# Patient Record
Sex: Male | Born: 1960 | Race: White | Hispanic: No | Marital: Married | Smoking: Former smoker
Health system: Southern US, Community
[De-identification: ages and names within clinical notes are randomized; demographics above are authoritative.]

## PROBLEM LIST (undated history)

## (undated) DIAGNOSIS — E785 Hyperlipidemia, unspecified: Secondary | ICD-10-CM

## (undated) DIAGNOSIS — N200 Calculus of kidney: Secondary | ICD-10-CM

## (undated) DIAGNOSIS — K219 Gastro-esophageal reflux disease without esophagitis: Secondary | ICD-10-CM

## (undated) DIAGNOSIS — T7840XA Allergy, unspecified, initial encounter: Secondary | ICD-10-CM

## (undated) HISTORY — DX: Gastro-esophageal reflux disease without esophagitis: K21.9

## (undated) HISTORY — DX: Allergy, unspecified, initial encounter: T78.40XA

## (undated) HISTORY — DX: Hyperlipidemia, unspecified: E78.5

## (undated) HISTORY — PX: LITHOTRIPSY: SUR834

## (undated) HISTORY — DX: Calculus of kidney: N20.0

---

## 2002-02-27 ENCOUNTER — Ambulatory Visit (HOSPITAL_COMMUNITY): Admission: RE | Admit: 2002-02-27 | Discharge: 2002-02-27 | Payer: Self-pay | Admitting: Gastroenterology

## 2002-09-17 ENCOUNTER — Encounter: Admission: RE | Admit: 2002-09-17 | Discharge: 2002-09-17 | Payer: Self-pay | Admitting: Urology

## 2002-09-17 ENCOUNTER — Encounter: Payer: Self-pay | Admitting: Urology

## 2002-10-30 ENCOUNTER — Encounter: Payer: Self-pay | Admitting: Urology

## 2002-10-30 ENCOUNTER — Encounter: Admission: RE | Admit: 2002-10-30 | Discharge: 2002-10-30 | Payer: Self-pay | Admitting: Urology

## 2002-11-04 ENCOUNTER — Encounter: Payer: Self-pay | Admitting: Urology

## 2002-11-04 ENCOUNTER — Ambulatory Visit (HOSPITAL_BASED_OUTPATIENT_CLINIC_OR_DEPARTMENT_OTHER): Admission: RE | Admit: 2002-11-04 | Discharge: 2002-11-04 | Payer: Self-pay | Admitting: Urology

## 2002-11-19 ENCOUNTER — Encounter: Payer: Self-pay | Admitting: Urology

## 2002-11-19 ENCOUNTER — Encounter: Admission: RE | Admit: 2002-11-19 | Discharge: 2002-11-19 | Payer: Self-pay | Admitting: Urology

## 2013-02-01 ENCOUNTER — Ambulatory Visit (INDEPENDENT_AMBULATORY_CARE_PROVIDER_SITE_OTHER): Payer: BC Managed Care – PPO | Admitting: Nurse Practitioner

## 2013-02-01 DIAGNOSIS — S91009A Unspecified open wound, unspecified ankle, initial encounter: Secondary | ICD-10-CM

## 2013-02-01 DIAGNOSIS — S81011A Laceration without foreign body, right knee, initial encounter: Secondary | ICD-10-CM

## 2013-02-01 DIAGNOSIS — Z23 Encounter for immunization: Secondary | ICD-10-CM

## 2013-02-01 MED ORDER — CEPHALEXIN 500 MG PO CAPS: 500.0000 mg | ORAL_CAPSULE | Freq: Three times a day (TID) | ORAL | Status: DC

## 2013-02-01 NOTE — Patient Instructions (Signed)
Wound Care  Wound care helps prevent pain and infection.    You may need a tetanus shot if:   You cannot remember when you had your last tetanus shot.   You have never had a tetanus shot.   The injury broke your skin.  If you need a tetanus shot and you choose not to have one, you may get tetanus. Sickness from tetanus can be serious.  HOME CARE     Only take medicine as told by your doctor.   Clean the wound daily with mild soap and water.   Change any bandages (dressings) as told by your doctor.   Put medicated cream and a bandage on the wound as told by your doctor.   Change the bandage if it gets wet, dirty, or starts to smell.   Take showers. Do not take baths, swim, or do anything that puts your wound under water.   Rest and raise (elevate) the wound until the pain and puffiness (swelling) are better.   Keep all doctor visits as told.  GET HELP RIGHT AWAY IF:     Yellowish-white fluid (pus) comes from the wound.   Medicine does not lessen your pain.   There is a red streak going away from the wound.   You have a fever.  MAKE SURE YOU:     Understand these instructions.   Will watch your condition.   Will get help right away if you are not doing well or get worse.  Document Released: 07/26/2008 Document Revised: 01/09/2012 Document Reviewed: 02/20/2011  Exit[employer] Patient Information 2013 [employer].

## 2013-02-01 NOTE — Progress Notes (Signed)
  Subjective:    Patient ID: Nicolas Ho, male    DOB: 1961-08-05, 52 y.o.   MRN: [id number]  Laceration  The incident occurred less than 1 hour ago. Pain location: R. Knee. The laceration is 6 cm in size. Injury mechanism: chainsaw. The patient is experiencing no pain. He reports no foreign bodies present. His tetanus status is out of date.  No numbness or tingling distally. Pt will receive Tetanus shot today.  Review of Systems  Constitutional: Negative.        Objective:   Physical Exam  Musculoskeletal:       Legs: 6 cm laceration to R. Knee cap   Verbal consent given by ptient. Local anesthesia Lidocaine 2% with 9ml Betadine prep 3-0 suture-8 simple interrupted stitches Cleaned with Saline Antibiotic ointment Dressing applied        Assessment & Plan:  Laceration  Keep clean and dry  Watch for signs of infection  retrun to office in 10 days stitch removal  TD shot today  Keflex as Rx  Nicolas Daphine Deutscher, FNP

## 2013-02-11 ENCOUNTER — Encounter: Payer: Self-pay | Admitting: Nurse Practitioner

## 2013-02-11 ENCOUNTER — Ambulatory Visit (INDEPENDENT_AMBULATORY_CARE_PROVIDER_SITE_OTHER): Payer: BC Managed Care – PPO | Admitting: Nurse Practitioner

## 2013-02-11 VITALS — BP 128/82 | HR 79 | Temp 97.6°F | Ht 68.0 in | Wt 168.0 lb

## 2013-02-11 DIAGNOSIS — Z4802 Encounter for removal of sutures: Secondary | ICD-10-CM

## 2013-02-11 NOTE — Progress Notes (Signed)
  Subjective:    Patient ID: Nicolas Ho, male    DOB: [birth year], 52 y.o.   MRN: [id number]  HPI Pt is here today for removal of sutures from knee. Pt has not noticed any signs of infection and denies any fevers.    Review of Systems  All other systems reviewed and are negative.       Objective:   Physical Exam  Wound edges of R. Knee well approximated. No erythema. No edema.       Assessment & Plan:  Sutures Removed  Dermabond applied  Keep clean and dry  Do not pick!  RTO PRN  Mary-Margaret Daphine Deutscher, FNP

## 2013-03-29 ENCOUNTER — Encounter: Payer: Self-pay | Admitting: Physician Assistant

## 2013-03-29 ENCOUNTER — Ambulatory Visit (INDEPENDENT_AMBULATORY_CARE_PROVIDER_SITE_OTHER): Payer: BC Managed Care – PPO | Admitting: Physician Assistant

## 2013-03-29 VITALS — BP 140/85 | HR 74 | Temp 97.2°F | Ht 69.0 in | Wt 159.0 lb

## 2013-03-29 DIAGNOSIS — R42 Dizziness and giddiness: Secondary | ICD-10-CM

## 2013-03-29 DIAGNOSIS — J309 Allergic rhinitis, unspecified: Secondary | ICD-10-CM

## 2013-03-29 LAB — COMPLETE METABOLIC PANEL WITH GFR
ALT: 17 U/L (ref 0–53)
AST: 18 U/L (ref 0–37)
Albumin: 4.5 g/dL (ref 3.5–5.2)
Alkaline Phosphatase: 41 U/L (ref 39–117)
BUN: 17 mg/dL (ref 6–23)
CO2: 28 mEq/L (ref 19–32)
Calcium: 9.9 mg/dL (ref 8.4–10.5)
Chloride: 103 mEq/L (ref 96–112)
Creat: 1.08 mg/dL (ref 0.50–1.35)
GFR, Est African American: >89 mL/min
GFR, Est Non African American: 78 mL/min
Glucose, Bld: 93 mg/dL (ref 70–99)
Potassium: 4.5 mEq/L (ref 3.5–5.3)
Sodium: 140 mEq/L (ref 135–145)
Total Bilirubin: 0.5 mg/dL (ref 0.3–1.2)
Total Protein: 6.7 g/dL (ref 6.0–8.3)

## 2013-03-29 LAB — POCT CBC
Granulocyte percent: 69.1 %G (ref 37–80)
HCT, POC: 39.9 % — AB (ref 43.5–53.7)
Hemoglobin: 13.3 g/dL — AB (ref 14.1–18.1)
Lymph, poc: 2.1 (ref 0.6–3.4)
MCH, POC: 29.6 pg (ref 27–31.2)
MCHC: 33.4 g/dL (ref 31.8–35.4)
MCV: 88.8 fL (ref 80–97)
MPV: 7.8 fL (ref 0–99.8)
POC Granulocyte: 5.7 (ref 2–6.9)
POC LYMPH PERCENT: 25.8 %L (ref 10–50)
Platelet Count, POC: 296 10*3/uL (ref 142–424)
RBC: 4.5 M/uL — AB (ref 4.69–6.13)
RDW, POC: 13.1 %
WBC: 8.2 10*3/uL (ref 4.6–10.2)

## 2013-03-29 MED ORDER — FLUTICASONE PROPIONATE 50 MCG/ACT NA SUSP: 2.0000 | Freq: Every day | NASAL | Status: DC

## 2013-03-29 NOTE — Progress Notes (Signed)
  Subjective:    Patient ID: Nicolas Ho, male    DOB: 29-Nov-1960, 52 y.o.   MRN: [id number]  HPI 52 y/o white male c/o occassional light-headedness and intermittent headache x 3 wks. He states that he has been working outside a lot lately and feels that his symptoms have worsened since being outside. Associated symptoms of increased sneezing and sinus pressure. He found three ticks on him over the past month and wants to make sure that his symptoms are not associated.     Review of Systems  Constitutional: Positive for fatigue. Negative for fever, chills and unexpected weight change.  HENT: Positive for sneezing and sinus pressure. Negative for hearing loss, ear pain, neck stiffness and ear discharge.   Eyes: Negative for itching.  Respiratory: Negative for apnea, cough, chest tightness, shortness of breath and wheezing.   Cardiovascular: Negative for chest pain, palpitations and leg swelling.  Gastrointestinal: Negative for nausea, vomiting, diarrhea and constipation.  Endocrine: Negative for cold intolerance, heat intolerance, polydipsia and polyuria.  Genitourinary: Negative for dysuria, urgency and frequency.  Skin: Negative for color change, pallor, rash and wound.  Neurological: Positive for dizziness (occasional when going from leaning forward to standing), light-headedness and headaches (increased frontal pressure).       Objective:   Physical Exam  Constitutional: He appears well-developed and well-nourished.  HENT:  Head: Normocephalic.  Right Ear: External ear normal.  Left Ear: External ear normal.  Mouth/Throat: Oropharynx is clear and moist.  Eyes: EOM are normal. Pupils are equal, round, and reactive to light.  Neck: No thyromegaly present.  Cardiovascular: Normal rate, regular rhythm and normal heart sounds.  Exam reveals no gallop and no friction rub.   No murmur heard. Pulmonary/Chest: No respiratory distress. He has no wheezes. He has no rales.   Lymphadenopathy:    He has no cervical adenopathy.  Skin: No rash noted. No erythema. No pallor.          Assessment & Plan:  I do not see any indications on PE that would lead me to feel that his cc is due to rickettsial associated infection. I am ordering CBC, CMP today to r/o any infection, anemia, electrolyte abnormalities or hypoglycemia. At this time, I feel like he is suffering from seasonal allergic rhinitis symptoms, which will hopefully be improved with regular use of the Flonase. Patient states that he is going to make an appt for a thorough physical examination in July 2014 and he will follow up sooner if symptoms worsen or persist. His BP today was elevated from previous visit so I advised him to check his BP regular at home so he will have records of it for his physical in July.

## 2013-05-10 ENCOUNTER — Ambulatory Visit (INDEPENDENT_AMBULATORY_CARE_PROVIDER_SITE_OTHER): Payer: BC Managed Care – PPO

## 2013-05-10 ENCOUNTER — Ambulatory Visit (INDEPENDENT_AMBULATORY_CARE_PROVIDER_SITE_OTHER): Payer: BC Managed Care – PPO | Admitting: Family Medicine

## 2013-05-10 ENCOUNTER — Encounter: Payer: Self-pay | Admitting: Family Medicine

## 2013-05-10 ENCOUNTER — Other Ambulatory Visit: Payer: Self-pay | Admitting: Family Medicine

## 2013-05-10 VITALS — BP 119/74 | HR 78 | Temp 97.9°F | Ht 68.0 in | Wt 155.0 lb

## 2013-05-10 DIAGNOSIS — J302 Other seasonal allergic rhinitis: Secondary | ICD-10-CM | POA: Insufficient documentation

## 2013-05-10 DIAGNOSIS — R5381 Other malaise: Secondary | ICD-10-CM

## 2013-05-10 DIAGNOSIS — R0989 Other specified symptoms and signs involving the circulatory and respiratory systems: Secondary | ICD-10-CM

## 2013-05-10 DIAGNOSIS — J3489 Other specified disorders of nose and nasal sinuses: Secondary | ICD-10-CM

## 2013-05-10 DIAGNOSIS — J309 Allergic rhinitis, unspecified: Secondary | ICD-10-CM

## 2013-05-10 DIAGNOSIS — Z0000 Encounter for general adult medical examination without abnormal findings: Secondary | ICD-10-CM | POA: Insufficient documentation

## 2013-05-10 DIAGNOSIS — E785 Hyperlipidemia, unspecified: Secondary | ICD-10-CM

## 2013-05-10 DIAGNOSIS — R5383 Other fatigue: Secondary | ICD-10-CM

## 2013-05-10 DIAGNOSIS — R0981 Nasal congestion: Secondary | ICD-10-CM

## 2013-05-10 DIAGNOSIS — Z119 Encounter for screening for infectious and parasitic diseases, unspecified: Secondary | ICD-10-CM

## 2013-05-10 DIAGNOSIS — E559 Vitamin D deficiency, unspecified: Secondary | ICD-10-CM

## 2013-05-10 LAB — POCT URINALYSIS DIPSTICK
Bilirubin, UA: NEGATIVE
Glucose, UA: NEGATIVE
Ketones, UA: NEGATIVE
Leukocytes, UA: NEGATIVE
Nitrite, UA: NEGATIVE
Protein, UA: NEGATIVE
Spec Grav, UA: 1.02
Urobilinogen, UA: NEGATIVE
pH, UA: 5

## 2013-05-10 LAB — COMPLETE METABOLIC PANEL WITH GFR
ALT: 15 U/L (ref 0–53)
AST: 14 U/L (ref 0–37)
Albumin: 4.7 g/dL (ref 3.5–5.2)
Alkaline Phosphatase: 39 U/L (ref 39–117)
BUN: 11 mg/dL (ref 6–23)
CO2: 28 mEq/L (ref 19–32)
Calcium: 9.6 mg/dL (ref 8.4–10.5)
Chloride: 105 mEq/L (ref 96–112)
Creat: 0.99 mg/dL (ref 0.50–1.35)
GFR, Est African American: >89 mL/min
GFR, Est Non African American: 87 mL/min
Glucose, Bld: 89 mg/dL (ref 70–99)
Potassium: 4.4 mEq/L (ref 3.5–5.3)
Sodium: 140 mEq/L (ref 135–145)
Total Bilirubin: 0.4 mg/dL (ref 0.3–1.2)
Total Protein: 6.8 g/dL (ref 6.0–8.3)

## 2013-05-10 LAB — POCT CBC
Granulocyte percent: 70.9 %G (ref 37–80)
HCT, POC: 37.4 % — AB (ref 43.5–53.7)
Hemoglobin: 13 g/dL — AB (ref 14.1–18.1)
Lymph, poc: 1.5 (ref 0.6–3.4)
MCH, POC: 30.5 pg (ref 27–31.2)
MCHC: 34.7 g/dL (ref 31.8–35.4)
MCV: 87.9 fL (ref 80–97)
MPV: 7.5 fL (ref 0–99.8)
POC Granulocyte: 4.8 (ref 2–6.9)
POC LYMPH PERCENT: 22.2 %L (ref 10–50)
Platelet Count, POC: 303 10*3/uL (ref 142–424)
RBC: 4.3 M/uL — AB (ref 4.69–6.13)
RDW, POC: 12.2 %
WBC: 6.8 10*3/uL (ref 4.6–10.2)

## 2013-05-10 LAB — HEPATITIS C ANTIBODY: HCV Ab: NEGATIVE

## 2013-05-10 LAB — VITAMIN B12: Vitamin B-12: 434 pg/mL (ref 211–911)

## 2013-05-10 LAB — TSH: TSH: 1.336 u[IU]/mL (ref 0.350–4.500)

## 2013-05-10 NOTE — Progress Notes (Signed)
Patient ID: Nicolas Ho, male   DOB: 1961/07/09, 52 y.o.   MRN: [id number] SUBJECTIVE: CC: Chief Complaint  Patient presents with  . Annual Exam    cpx  needs refills last EKG-2013  fasting     HPI: Here for annual Physical. However he was seen here couple months ago and has persistent pressure in his  Sinuses and a chest congestion feeling left upper chest with goughing up phlegm. May all be allergies but in his teens early 98s he smoked a little. Rest of his health problems have  Been fine.  Past Medical History  Diagnosis Date  . GERD (gastroesophageal reflux disease)   . Hyperlipidemia    Past Surgical History  Procedure Laterality Date  . Lithotripsy     History   Social History  . Marital Status: Married    Spouse Name: N/A    Number of Children: N/A  . Years of Education: N/A   Occupational History  . custodian     [employer]   Social History Main Topics  . Smoking status: Former Smoker    Types: Cigarettes    Quit date: 05/11/1983  . Smokeless tobacco: Not on file  . Alcohol Use: Yes  . Drug Use: No  . Sexually Active: Not on file   Other Topics Concern  . Not on file   Social History Narrative   Married   2 children   1 dog         Family History  Problem Relation Age of Onset  . Diabetes Mother   . Cancer Father   . Hyperlipidemia Sister   . Heart disease Brother   . Cancer Brother    Current Outpatient Prescriptions on File Prior to Visit  Medication Sig Dispense Refill  . cholecalciferol (VITAMIN D) 1000 UNITS tablet Take 1,000 Units by mouth daily.      . fluticasone (FLONASE) 50 MCG/ACT nasal spray Place 2 sprays into the nose daily.  16 g  6   No current facility-administered medications on file prior to visit.   No Active Allergies Immunization History  Administered Date(s) Administered  . Tdap 02/01/2013   Prior to Admission medications   Medication Sig Start Date End Date Taking? Authorizing Provider  cholecalciferol  (VITAMIN D) 1000 UNITS tablet Take 1,000 Units by mouth daily.   Yes Historical Provider, MD  simvastatin (ZOCOR) 10 MG tablet Take 5 mg by mouth at bedtime.   Yes Historical Provider, MD  fluticasone (FLONASE) 50 MCG/ACT nasal spray Place 2 sprays into the nose daily. 03/29/13   Tiffany Gann, PA-C     ROS: As above in the HPI. All other systems are stable or negative.  OBJECTIVE: APPEARANCE:  Patient in no acute distress.The patient appeared well nourished and normally developed. Acyanotic. Waist: VITAL SIGNS:BP 119/74  Pulse 78  Temp(Src) 97.9 F (36.6 C) (Oral)  Ht 5\' 8"  (1.727 m)  Wt 155 lb (70.308 kg)  BMI 23.57 kg/m2 WM looks well. congested  SKIN: warm and  Dry without overt rashes, tattoos and scars  HEAD and Neck: without JVD, Head and scalp: normal Eyes:No scleral icterus. Fundi normal, eye movements normal. Ears: Auricle normal, canal normal, Tympanic membranes normal, insufflation normal. Nose: boggy nasal mucosa Throat: normal Neck & thyroid: normal  CHEST & LUNGS: Chest wall: normal Lungs: Clear, with an occasional congested cough  CVS: Reveals the PMI to be normally located. Regular rhythm, First and Second Heart sounds are normal,  absence of murmurs,  rubs or gallops. Peripheral vasculature: Radial pulses: normal Dorsal pedis pulses: normal Posterior pulses: normal  ABDOMEN:  Appearance: normal Benign, no organomegaly, no masses, no Abdominal Aortic enlargement. No Guarding , no rebound. No Bruits. Bowel sounds: normal  RECTAL: Normal, prostate normal and his stools were heme negative and scant brown GU: Normal testes , penis and no hernias detected.  EXTREMETIES: nonedematous. Both Femoral and Pedal pulses are normal.  MUSCULOSKELETAL:  Spine: normal Joints: intact  NEUROLOGIC: oriented to time,place and person; nonfocal. Strength is normal Sensory is normal Reflexes are normal Cranial Nerves are normal.  ASSESSMENT: Annual  physical exam  HLD (hyperlipidemia) - Plan: NMR Lipoprofile with Lipids, COMPLETE METABOLIC PANEL WITH GFR  Seasonal allergic rhinitis  Unspecified vitamin D deficiency  Chest congestion - Plan: DG Chest 2 View  Head congestion - Plan: DG Sinuses Complete  Other malaise and fatigue - Plan: TSH, POCT CBC, Vitamin B12, Vitamin D 25 hydroxy, POCT urinalysis dipstick  Screening examination for infectious disease - Plan: Hepatitis C antibody   PLAN:  [facility] reading (PRIMARY) by  Dr. Modesto Charon Sinus films and CXray were no acute findings.  Orders Placed This Encounter  Procedures  . DG Sinuses Complete    Standing Status: Future     Number of Occurrences: 1     Standing Expiration Date: 07/10/2014    Order Specific Question:  Reason for Exam (SYMPTOM  OR DIAGNOSIS REQUIRED)    Answer:  head congestion    Order Specific Question:  Preferred imaging location?    Answer:  Internal  . DG Chest 2 View    Standing Status: Future     Number of Occurrences: 1     Standing Expiration Date: 07/10/2014    Order Specific Question:  Reason for Exam (SYMPTOM  OR DIAGNOSIS REQUIRED)    Answer:  chest congestion  ex-smoker    Order Specific Question:  Preferred imaging location?    Answer:  Internal  . NMR Lipoprofile with Lipids  . COMPLETE METABOLIC PANEL WITH GFR  . TSH  . Vitamin B12  . Vitamin D 25 hydroxy  . Hepatitis C antibody  . POCT CBC  . POCT urinalysis dipstick   Meds ordered this encounter  Medications  . simvastatin (ZOCOR) 10 MG tablet    Sig: Take 5 mg by mouth at bedtime.                                 HEALTH MAINTENANCE Immunizations: Tetanus-Diphtheria Booster due:02/02/2023 Pertusis Booster due:02/02/2023 Flu Shot ZOX:[id number] Herpes Zoster/Shingles Vaccine due:at 59 years HPV due:n/a  Healthy Life Habits:  Exercise Goal: 5-6 days/week; start gradually(ie 30 minutes/3days per week)  Nutrition: Balanced healthy meals including Vegetables and Fruits. Consider   Reading the following books: 1) Eat to Live by Dr Ottis Stain; 2) Prevent and Reverse Heart Disease by Pneumonia Vaccine:at 64 years Dr Suzzette Righter.  Vitamins:regular Multivitamin Aspirin:81 mg Stop Tobacco JWJ:XBJYN  tobacco Seat Belt Use:+++ Sunscreen Use:+++  Recommended Screening Tests: Colon Cancer Screening: 2022 Blood work: today Cholesterol Screening:       today HIV:    ?   Hepatitis C(people born 26-1966): to be done  Monthly Self Testicular Exam:+++  Eye Exam:every 1 to 2 years Dental Health:every 6 months  Others:    Living Will/Healthcare Power of Attorney:  Use the flonase.  Observe for now  Await labs.  Return in about 6 months (around 11/10/2013)  for Recheck medical problems.  Mike Berntsen P. Modesto Charon, M.D.

## 2013-05-10 NOTE — Patient Instructions (Addendum)
HEALTH MAINTENANCE Immunizations: Tetanus-Diphtheria Booster due:02/02/2023 Pertusis Booster due:02/02/2023 Flu Shot ZOX:WRUEAVWU Herpes Zoster/Shingles Vaccine due:at 59 years HPV due:n/a  Healthy Life Habits:  Exercise Goal: 5-6 days/week; start gradually(ie 30 minutes/3days per week)  Nutrition: Balanced healthy meals including Vegetables and Fruits. Consider  Reading the following books: 1) Eat to Live by Dr Ottis Stain; 2) Prevent and Reverse Heart Disease by Pneumonia Vaccine:at 64 years Dr Suzzette Righter.  Vitamins:regular Multivitamin Aspirin:81 mg Stop Tobacco JWJ:XBJYN  tobacco Seat Belt Use:+++ Sunscreen Use:+++  Recommended Screening Tests: Colon Cancer Screening: 2022 Blood work: today Cholesterol Screening:       today HIV:    ?   Hepatitis C(people born 47-1966): to be done  Monthly Self Testicular Exam:+++  Eye Exam:every 1 to 2 years Dental Health:every 6 months  Others:    Living Will/Healthcare Power of Attorney:

## 2013-05-11 LAB — VITAMIN D 25 HYDROXY (VIT D DEFICIENCY, FRACTURES): Vit D, 25-Hydroxy: 57 ng/mL (ref 30–89)

## 2013-05-13 ENCOUNTER — Other Ambulatory Visit: Payer: Self-pay | Admitting: *Deleted

## 2013-05-13 DIAGNOSIS — Z0000 Encounter for general adult medical examination without abnormal findings: Secondary | ICD-10-CM

## 2013-05-13 LAB — PSA: PSA: 0.93 ng/mL (ref <=4.00)

## 2013-05-13 NOTE — Addendum Note (Signed)
Addended by: Orma Render F on: 05/13/2013 08:30 AM   Modules accepted: Orders

## 2013-05-14 LAB — NMR LIPOPROFILE WITH LIPIDS
Cholesterol, Total: 196 mg/dL (ref <200)
HDL Particle Number: 36.5 umol/L (ref >=30.5)
HDL Size: 8.6 nm — ABNORMAL LOW (ref >=9.2)
HDL-C: 48 mg/dL (ref >=40)
LDL (calc): 127 mg/dL — ABNORMAL HIGH (ref <100)
LDL Particle Number: 1955 nmol/L — ABNORMAL HIGH (ref <1000)
LDL Size: 20.2 nm — ABNORMAL LOW (ref >20.5)
LP-IR Score: 54 — ABNORMAL HIGH (ref <=45)
Large HDL-P: 3.8 umol/L — ABNORMAL LOW (ref >=4.8)
Large VLDL-P: 1.4 nmol/L (ref <=2.7)
Small LDL Particle Number: 1258 nmol/L — ABNORMAL HIGH (ref <=527)
Triglycerides: 104 mg/dL (ref <150)
VLDL Size: 41.8 nm (ref <=46.6)

## 2013-05-17 ENCOUNTER — Telehealth: Payer: Self-pay | Admitting: Family Medicine

## 2013-05-17 NOTE — Telephone Encounter (Signed)
Pt notified and informed Dr Modesto Charon not reviewed labs.  Pt has signed up for MyChart--advised to continue to look for labs over weekend

## 2013-05-30 ENCOUNTER — Other Ambulatory Visit: Payer: Self-pay | Admitting: Family Medicine

## 2013-09-05 ENCOUNTER — Other Ambulatory Visit: Payer: Self-pay

## 2014-05-14 ENCOUNTER — Ambulatory Visit (INDEPENDENT_AMBULATORY_CARE_PROVIDER_SITE_OTHER): Payer: BC Managed Care – PPO | Admitting: Family Medicine

## 2014-05-14 ENCOUNTER — Encounter: Payer: Self-pay | Admitting: Family Medicine

## 2014-05-14 VITALS — BP 131/76 | HR 77 | Temp 97.7°F | Resp 20 | Ht 68.0 in | Wt 161.0 lb

## 2014-05-14 DIAGNOSIS — H109 Unspecified conjunctivitis: Secondary | ICD-10-CM

## 2014-05-14 DIAGNOSIS — E785 Hyperlipidemia, unspecified: Secondary | ICD-10-CM

## 2014-05-14 DIAGNOSIS — Z0000 Encounter for general adult medical examination without abnormal findings: Secondary | ICD-10-CM

## 2014-05-14 DIAGNOSIS — R5383 Other fatigue: Secondary | ICD-10-CM

## 2014-05-14 DIAGNOSIS — M7711 Lateral epicondylitis, right elbow: Secondary | ICD-10-CM

## 2014-05-14 MED ORDER — NAPROXEN 500 MG PO TABS: 500.0000 mg | ORAL_TABLET | Freq: Two times a day (BID) | ORAL | Status: DC

## 2014-05-14 MED ORDER — POLYMYXIN B-TRIMETHOPRIM 10000-0.1 UNIT/ML-% OP SOLN: 1.0000 [drp] | OPHTHALMIC | Status: DC

## 2014-05-14 NOTE — Addendum Note (Signed)
Addended by: Orma RenderHODGES, Jquan Egelston F on: 05/14/2014 12:55 PM   Modules accepted: Orders

## 2014-05-14 NOTE — Progress Notes (Signed)
   Subjective:    Patient ID: OZIAS DICENZO, male    DOB: [birth year], 53 y.o.   MRN: [id number]  HPI This 53 y.o. male presents for evaluation of CPE. He is having some right eye puffiness and right arm discomfort.   Review of Systems No chest pain, SOB, HA, dizziness, vision change, N/V, diarrhea, constipation, dysuria, urinary urgency or frequency, myalgias, arthralgias or rash.     Objective:   Physical Exam  Vital signs noted  Well developed well nourished male.  HEENT - Head atraumatic Normocephalic                Eyes - PERRLA, Conjuctiva - injected OD and clear OS Sclera- Clear EOMI                Ears - EAC's Wnl TM's Wnl Gross Hearing WNL                Nose - Nares patent                 Throat - oropharanx wnl Respiratory - Lungs CTA bilateral Cardiac - RRR S1 and S2 without murmur GI - Abdomen soft Nontender and bowel sounds active x 4 Extremities - No edema. Neuro - Grossly intact. MS - TTP left lateral elbow     Assessment & Plan:  Other fatigue - Plan: POCT CBC, Thyroid Panel With TSH  Hyperlipemia - Plan: CMP14+EGFR, Lipid panel  Wellness examination - Plan: POCT CBC, CMP14+EGFR, Lipid panel, Thyroid Panel With TSH, PSA, total and free  Epicondylitis, lateral (tennis elbow), right - Plan: naproxen (NAPROSYN) 500 MG tablet  Bilateral conjunctivitis - Plan: trimethoprim-polymyxin b (POLYTRIM) ophthalmic solution  Follow up in one year  Lysbeth Penner FNP

## 2014-05-15 LAB — CBC WITH DIFFERENTIAL
Basophils Absolute: 0 10*3/uL (ref 0.0–0.2)
Basos: 0 %
Eos: 3 %
Eosinophils Absolute: 0.2 10*3/uL (ref 0.0–0.4)
HCT: 36.6 % — ABNORMAL LOW (ref 37.5–51.0)
Hemoglobin: 12.4 g/dL — ABNORMAL LOW (ref 12.6–17.7)
Immature Grans (Abs): 0 10*3/uL (ref 0.0–0.1)
Immature Granulocytes: 0 %
Lymphocytes Absolute: 1.9 10*3/uL (ref 0.7–3.1)
Lymphs: 24 %
MCH: 28.7 pg (ref 26.6–33.0)
MCHC: 33.9 g/dL (ref 31.5–35.7)
MCV: 85 fL (ref 79–97)
Monocytes Absolute: 0.6 10*3/uL (ref 0.1–0.9)
Monocytes: 8 %
Neutrophils Absolute: 5 10*3/uL (ref 1.4–7.0)
Neutrophils Relative %: 65 %
Platelets: 318 10*3/uL (ref 150–379)
RBC: 4.32 x10E6/uL (ref 4.14–5.80)
RDW: 14.3 % (ref 12.3–15.4)
WBC: 7.6 10*3/uL (ref 3.4–10.8)

## 2014-05-15 LAB — CMP14+EGFR
ALT: 16 IU/L (ref 0–44)
AST: 17 IU/L (ref 0–40)
Albumin/Globulin Ratio: 2.8 — ABNORMAL HIGH (ref 1.1–2.5)
Albumin: 4.8 g/dL (ref 3.5–5.5)
Alkaline Phosphatase: 43 IU/L (ref 39–117)
BUN/Creatinine Ratio: 13 (ref 9–20)
BUN: 13 mg/dL (ref 6–24)
CO2: 24 mmol/L (ref 18–29)
Calcium: 9.7 mg/dL (ref 8.7–10.2)
Chloride: 102 mmol/L (ref 97–108)
Creatinine, Ser: 1.03 mg/dL (ref 0.76–1.27)
GFR calc Af Amer: 95 mL/min/{1.73_m2} (ref >59)
GFR calc non Af Amer: 83 mL/min/{1.73_m2} (ref >59)
Globulin, Total: 1.7 g/dL (ref 1.5–4.5)
Glucose: 87 mg/dL (ref 65–99)
Potassium: 4.5 mmol/L (ref 3.5–5.2)
Sodium: 143 mmol/L (ref 134–144)
Total Bilirubin: 0.4 mg/dL (ref 0.0–1.2)
Total Protein: 6.5 g/dL (ref 6.0–8.5)

## 2014-05-15 LAB — LIPID PANEL
Chol/HDL Ratio: 3.8 ratio units (ref 0.0–5.0)
Cholesterol, Total: 230 mg/dL — ABNORMAL HIGH (ref 100–199)
HDL: 60 mg/dL (ref >39)
LDL Calculated: 149 mg/dL — ABNORMAL HIGH (ref 0–99)
Triglycerides: 103 mg/dL (ref 0–149)
VLDL Cholesterol Cal: 21 mg/dL (ref 5–40)

## 2014-05-15 LAB — PSA, TOTAL AND FREE
PSA, Free Pct: 38.8 %
PSA, Free: 0.31 ng/mL
PSA: 0.8 ng/mL (ref 0.0–4.0)

## 2014-05-15 LAB — THYROID PANEL WITH TSH
Free Thyroxine Index: 2.6 (ref 1.2–4.9)
T3 Uptake Ratio: 31 % (ref 24–39)
T4, Total: 8.3 ug/dL (ref 4.5–12.0)
TSH: 3.23 u[IU]/mL (ref 0.450–4.500)

## 2014-05-19 ENCOUNTER — Telehealth: Payer: Self-pay | Admitting: Family Medicine

## 2014-05-19 NOTE — Telephone Encounter (Signed)
Message copied by Azalee CourseFULP, ASHLEY on Mon May 19, 2014  2:38 PM ------      Message from: Deatra CanterXFORD, WILLIAM J      Created: Mon May 19, 2014  1:57 PM       Lipids elevated and recommend using fish oil tablets otc ------

## 2014-06-30 ENCOUNTER — Telehealth: Payer: Self-pay | Admitting: Family Medicine

## 2014-07-01 ENCOUNTER — Other Ambulatory Visit: Payer: Self-pay | Admitting: Family Medicine

## 2014-07-01 DIAGNOSIS — H109 Unspecified conjunctivitis: Secondary | ICD-10-CM

## 2014-07-01 MED ORDER — POLYMYXIN B-TRIMETHOPRIM 10000-0.1 UNIT/ML-% OP SOLN: 1.0000 [drp] | OPHTHALMIC | Status: DC

## 2014-07-01 NOTE — Telephone Encounter (Signed)
Using eye drops for allergies.

## 2014-08-05 ENCOUNTER — Telehealth: Payer: Self-pay | Admitting: Family Medicine

## 2014-08-05 NOTE — Telephone Encounter (Signed)
Pt needs to be seen

## 2014-08-05 NOTE — Telephone Encounter (Signed)
appointment made

## 2014-08-06 ENCOUNTER — Encounter: Payer: Self-pay | Admitting: Nurse Practitioner

## 2014-08-06 ENCOUNTER — Ambulatory Visit (INDEPENDENT_AMBULATORY_CARE_PROVIDER_SITE_OTHER): Payer: BC Managed Care – PPO

## 2014-08-06 ENCOUNTER — Ambulatory Visit (INDEPENDENT_AMBULATORY_CARE_PROVIDER_SITE_OTHER): Payer: BC Managed Care – PPO | Admitting: Nurse Practitioner

## 2014-08-06 VITALS — BP 135/84 | HR 87 | Temp 97.5°F | Ht 68.0 in | Wt 162.8 lb

## 2014-08-06 DIAGNOSIS — R300 Dysuria: Secondary | ICD-10-CM

## 2014-08-06 DIAGNOSIS — R1032 Left lower quadrant pain: Secondary | ICD-10-CM

## 2014-08-06 DIAGNOSIS — K5900 Constipation, unspecified: Secondary | ICD-10-CM

## 2014-08-06 DIAGNOSIS — R1030 Lower abdominal pain, unspecified: Secondary | ICD-10-CM

## 2014-08-06 LAB — POCT URINALYSIS DIPSTICK
Bilirubin, UA: NEGATIVE
Glucose, UA: NEGATIVE
Ketones, UA: NEGATIVE
Leukocytes, UA: NEGATIVE
Nitrite, UA: NEGATIVE
PH UA: 7.5
PROTEIN UA: NEGATIVE
Urobilinogen, UA: NEGATIVE

## 2014-08-06 LAB — POCT UA - MICROSCOPIC ONLY
Bacteria, U Microscopic: NEGATIVE
CASTS, UR, LPF, POC: NEGATIVE
Crystals, Ur, HPF, POC: NEGATIVE
Mucus, UA: NEGATIVE
WBC, UR, HPF, POC: NEGATIVE
YEAST UA: NEGATIVE

## 2014-08-06 MED ORDER — SULFAMETHOXAZOLE-TMP DS 800-160 MG PO TABS: 1.0000 | ORAL_TABLET | Freq: Two times a day (BID) | ORAL | Status: DC

## 2014-08-06 NOTE — Patient Instructions (Signed)

## 2014-08-06 NOTE — Progress Notes (Signed)
   Subjective:    Patient ID: Nicolas Ho, male    DOB: 05-26-1961, 53 y.o.   MRN: [id number]  HPI Patient is here today complaining of dysuria that started two weeks ago. He had pass a kidney two weeks ago. He reports discomfort on his left lower groin.  He denies any blood in his urine. Denies any discharge.  Rates pain 4-5/10- worse at night.   Review of Systems  HENT: Negative.   Eyes: Negative.   Respiratory: Negative.   Cardiovascular: Negative.   Gastrointestinal: Negative.   Endocrine: Negative.   Genitourinary: Positive for dysuria and urgency. Negative for hematuria.  Neurological: Negative.   Hematological: Negative.   Psychiatric/Behavioral: Negative.   All other systems reviewed and are negative.      Objective:   Physical Exam  Constitutional: He is oriented to person, place, and time. He appears well-developed and well-nourished.  HENT:  Head: Normocephalic.  Eyes: Pupils are equal, round, and reactive to light.  Neck: Normal range of motion.  Cardiovascular: Normal rate.   Pulmonary/Chest: Effort normal.  Abdominal: Soft.  Mild pain left groin- no nodules palpable  Musculoskeletal: Normal range of motion.  No cva tenderness.   Neurological: He is alert and oriented to person, place, and time.     BP 135/84  Pulse 87  Temp(Src) 97.5 F (36.4 C) (Oral)  Ht 5\' 8"  (1.727 m)  Wt 162 lb 12.8 oz (73.846 kg)  BMI 24.76 kg/m2   Results for orders placed in visit on 08/06/14  POCT URINALYSIS DIPSTICK      Result Value Ref Range   Color, UA straw     Clarity, UA clear     Glucose, UA neg     Bilirubin, UA neg     Ketones, UA neg     Spec Grav, UA <=1.005     Blood, UA small     pH, UA 7.5     Protein, UA neg     Urobilinogen, UA negative     Nitrite, UA neg     Leukocytes, UA Negative    POCT UA - MICROSCOPIC ONLY      Result Value Ref Range   WBC, Ur, HPF, POC neg     RBC, urine, microscopic 1-5     Bacteria, U Microscopic neg     Mucus, UA  neg     Epithelial cells, urine per micros occ     Crystals, Ur, HPF, POC neg     Casts, Ur, LPF, POC neg     Yeast, UA neg     KUB- Moderate amount of stool burden-Preliminary reading by Paulene FloorMary Breland Trouten, FNP  [facility]      Assessment & Plan:  1. Dysuria Force fluids - POCT urinalysis dipstick - POCT UA - Microscopic Only - DG Abd 1 View; Future - sulfamethoxazole-trimethoprim (BACTRIM DS) 800-160 MG per tablet; Take 1 tablet by mouth 2 (two) times daily.  Dispense: 20 tablet; Refill: 0  2. Constipation, unspecified constipation type miralax OTC  Increase fiber in diet  3. Left groin pain Keep diary of pain If worsens may need U/S Naprosyn as rx- take with food  Mary-Margaret Daphine DeutscherMartin, FNP

## 2016-02-09 ENCOUNTER — Ambulatory Visit (INDEPENDENT_AMBULATORY_CARE_PROVIDER_SITE_OTHER): Payer: BC Managed Care – PPO | Admitting: Family Medicine

## 2016-02-09 ENCOUNTER — Encounter: Payer: Self-pay | Admitting: Family Medicine

## 2016-02-09 VITALS — BP 141/88 | HR 83 | Temp 97.8°F | Ht 68.0 in | Wt 163.8 lb

## 2016-02-09 DIAGNOSIS — H02843 Edema of right eye, unspecified eyelid: Secondary | ICD-10-CM

## 2016-02-09 DIAGNOSIS — Z87442 Personal history of urinary calculi: Secondary | ICD-10-CM

## 2016-02-09 DIAGNOSIS — Z0000 Encounter for general adult medical examination without abnormal findings: Secondary | ICD-10-CM | POA: Diagnosis not present

## 2016-02-09 DIAGNOSIS — E785 Hyperlipidemia, unspecified: Secondary | ICD-10-CM

## 2016-02-09 DIAGNOSIS — R0989 Other specified symptoms and signs involving the circulatory and respiratory systems: Secondary | ICD-10-CM

## 2016-02-09 DIAGNOSIS — J302 Other seasonal allergic rhinitis: Secondary | ICD-10-CM

## 2016-02-09 LAB — URINALYSIS, COMPLETE
Bilirubin, UA: NEGATIVE
GLUCOSE, UA: NEGATIVE
Ketones, UA: NEGATIVE
Nitrite, UA: NEGATIVE
PH UA: 5.5 (ref 5.0–7.5)
PROTEIN UA: NEGATIVE
Specific Gravity, UA: 1.025 (ref 1.005–1.030)
Urobilinogen, Ur: 0.2 mg/dL (ref 0.2–1.0)

## 2016-02-09 LAB — MICROSCOPIC EXAMINATION: Epithelial Cells (non renal): NONE SEEN /hpf (ref 0–10)

## 2016-02-09 NOTE — Progress Notes (Signed)
   HPI  Patient presents today here for annual exam and to discuss right eye swelling.  He feels he is in good health.  Has a nonsymptomatic skin nodule of the left upper back. No changing recently no itching, no bleeding.  Has not been taking cholesterol medicine Watches his diet closely Active, no formal exercise   Has a history of calcium oxalate stones, has occasional renal colic symptoms, states they are bothering quite a while. He would like a screening urine test.  Is not a smoker. His 25 year old brother has lung cancer, he was a smoker urine life. He has seasonal allergies and chest congestion helped by Flonase  Right eye swelling Describes right lower eyelid and slight swelling in the right eye that happens every morning, it lasts about 5 minutes and resolves on its own. Occasionally he is a small amount of scabbing on his right lower eyelid. He has no vision changes, no itching, and no redness of the sclera and conjunctiva.  PMH: Smoking status noted ROS: Per HPI  Objective: BP 141/88 mmHg  Pulse 83  Temp(Src) 97.8 F (36.6 C) (Oral)  Ht '5\' 8"'$  (1.727 m)  Wt 163 lb 12.8 oz (74.299 kg)  BMI 24.91 kg/m2 Gen: NAD, alert, cooperative with exam HEENT: NCAT, EOMI, PERRL, TMs normal bilaterally, turbinates swollen bilaterally, oropharynx clear  CV: RRR, good S1/S2, no murmur Resp: CTABL, no wheezes, non-labored Abd: SNTND, BS present, no guarding or organomegaly Ext: No edema, warm Neuro: Alert and oriented, No gross deficits  Left upper back on his shoulder blade and using a 7 mm subcutaneous nodule that's firm and nontender to palpation, flesh-colored, no erythema or exudate  Assessment and plan:  # Annual physical exam Normal exam Labs  # Hyperlipidemia Mild Not taking medication Labs, he is watching his diet  # Eyelid swelling Reassurance provided Possible blepharitis, however mild. Discussed possible venous congestion as he sleeps on his right  side and has right-sided symptoms that last 5 minutes after waking consider contribution from allergic rhinitis, consider Claritin or Zyrtec  # Seasonal allergies Discussed Consider Claritin or Zyrtec  Skin lesion Appears benign, unclear etiology Watchful waiting    Orders Placed This Encounter  Procedures  . Lipid Panel  . CMP14+EGFR  . CBC with Differential  . PSA    Laroy Apple, MD [facility] 02/09/2016, 10:26 AM

## 2016-02-09 NOTE — Patient Instructions (Signed)
Great to meet you!  Consider a daily zyrtec or claritin ( the claritin D or zyrtecD)  If your eye causes you any additional concern, let us know. We could help you get an eye exam set up  We will call within 1 week with labs

## 2016-02-10 LAB — CBC WITH DIFFERENTIAL/PLATELET
BASOS ABS: 0 10*3/uL (ref 0.0–0.2)
Basos: 0 %
EOS (ABSOLUTE): 0.1 10*3/uL (ref 0.0–0.4)
Eos: 1 %
Hematocrit: 42.3 % (ref 37.5–51.0)
Hemoglobin: 14.2 g/dL (ref 12.6–17.7)
IMMATURE GRANS (ABS): 0 10*3/uL (ref 0.0–0.1)
Immature Granulocytes: 0 %
LYMPHS: 26 %
Lymphocytes Absolute: 1.5 10*3/uL (ref 0.7–3.1)
MCH: 29.3 pg (ref 26.6–33.0)
MCHC: 33.6 g/dL (ref 31.5–35.7)
MCV: 87 fL (ref 79–97)
Monocytes Absolute: 0.4 10*3/uL (ref 0.1–0.9)
Monocytes: 7 %
NEUTROS ABS: 3.9 10*3/uL (ref 1.4–7.0)
Neutrophils: 66 %
PLATELETS: 301 10*3/uL (ref 150–379)
RBC: 4.85 x10E6/uL (ref 4.14–5.80)
RDW: 14.1 % (ref 12.3–15.4)
WBC: 6 10*3/uL (ref 3.4–10.8)

## 2016-02-10 LAB — CMP14+EGFR
ALT: 27 IU/L (ref 0–44)
AST: 26 IU/L (ref 0–40)
Albumin/Globulin Ratio: 2 (ref 1.2–2.2)
Albumin: 4.7 g/dL (ref 3.5–5.5)
Alkaline Phosphatase: 51 IU/L (ref 39–117)
BILIRUBIN TOTAL: 0.4 mg/dL (ref 0.0–1.2)
BUN/Creatinine Ratio: 11 (ref 9–20)
BUN: 11 mg/dL (ref 6–24)
CO2: 23 mmol/L (ref 18–29)
Calcium: 9.3 mg/dL (ref 8.7–10.2)
Chloride: 100 mmol/L (ref 96–106)
Creatinine, Ser: 1 mg/dL (ref 0.76–1.27)
GFR calc Af Amer: 98 mL/min/{1.73_m2} (ref >59)
GFR calc non Af Amer: 84 mL/min/{1.73_m2} (ref >59)
GLUCOSE: 93 mg/dL (ref 65–99)
Globulin, Total: 2.4 g/dL (ref 1.5–4.5)
Potassium: 4.4 mmol/L (ref 3.5–5.2)
Sodium: 141 mmol/L (ref 134–144)
Total Protein: 7.1 g/dL (ref 6.0–8.5)

## 2016-02-10 LAB — PSA: PROSTATE SPECIFIC AG, SERUM: 1.2 ng/mL (ref 0.0–4.0)

## 2016-02-10 LAB — LIPID PANEL
CHOL/HDL RATIO: 4.5 ratio (ref 0.0–5.0)
Cholesterol, Total: 254 mg/dL — ABNORMAL HIGH (ref 100–199)
HDL: 56 mg/dL (ref >39)
LDL CALC: 173 mg/dL — AB (ref 0–99)
Triglycerides: 125 mg/dL (ref 0–149)
VLDL CHOLESTEROL CAL: 25 mg/dL (ref 5–40)

## 2016-02-11 ENCOUNTER — Telehealth: Payer: Self-pay | Admitting: Family Medicine

## 2016-02-11 NOTE — Telephone Encounter (Signed)
Pt notified of results Verbalizes understanding 

## 2016-04-20 IMAGING — CR DG ABDOMEN 1V
1 series · 1 of 1 positions shown · non-contrast
Comparison: 01/20/2005 and 01/15/2010

CLINICAL DATA: Initial evaluation for left lower quadrant pain and
macroscopic hematuria previous history of kidney stones

EXAM:
ABDOMEN - 1 VIEW

[view not recorded]
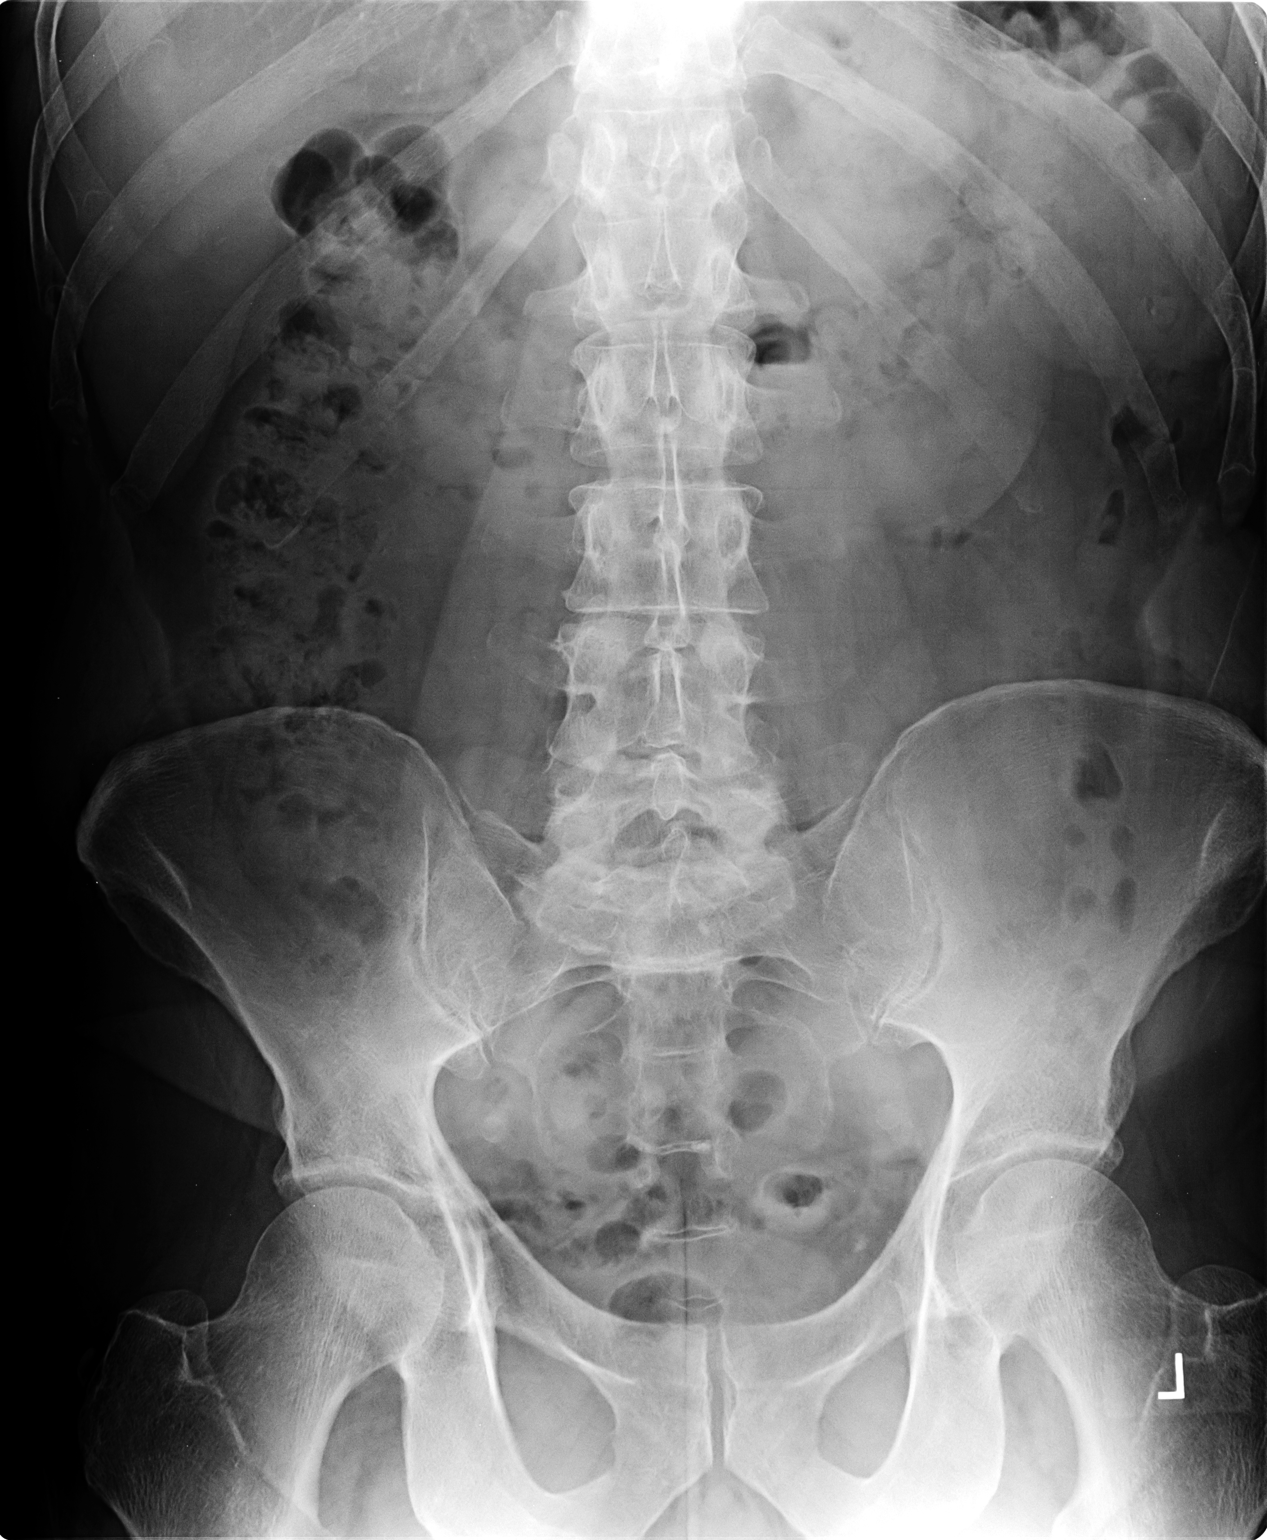

[1 of 1 positions shown; findings below may reference images not displayed]

FINDINGS: Moderate stool throughout the colon. No abnormal opacities over the
renal shadows to suggest calculi. 4 mm opacity lateral left pelvis
may represent a phlebolith as seen on 01/20/2005 CT scan. However,
the possibility in the appropriate clinical setting of the distal
left ureteral stone is not entirely excluded.
IMPRESSION: Left lower quadrant calcification could represent a phlebolith, but
the possibility of a ureteral stone is not excluded and this could
be further evaluated with CT urogram if indicated.

## 2017-09-11 ENCOUNTER — Ambulatory Visit (INDEPENDENT_AMBULATORY_CARE_PROVIDER_SITE_OTHER): Payer: BC Managed Care – PPO | Admitting: Family Medicine

## 2017-09-11 ENCOUNTER — Encounter: Payer: Self-pay | Admitting: Family Medicine

## 2017-09-11 VITALS — BP 134/80 | HR 76 | Temp 97.9°F | Ht 68.0 in | Wt 167.0 lb

## 2017-09-11 DIAGNOSIS — E559 Vitamin D deficiency, unspecified: Secondary | ICD-10-CM

## 2017-09-11 DIAGNOSIS — Z0000 Encounter for general adult medical examination without abnormal findings: Secondary | ICD-10-CM

## 2017-09-11 DIAGNOSIS — H04559 Acquired stenosis of unspecified nasolacrimal duct: Secondary | ICD-10-CM

## 2017-09-11 DIAGNOSIS — E7800 Pure hypercholesterolemia, unspecified: Secondary | ICD-10-CM

## 2017-09-11 LAB — URINALYSIS
BILIRUBIN UA: NEGATIVE
Glucose, UA: NEGATIVE
Ketones, UA: NEGATIVE
Leukocytes, UA: NEGATIVE
NITRITE UA: NEGATIVE
PH UA: 6 (ref 5.0–7.5)
Protein, UA: NEGATIVE
RBC, UA: NEGATIVE
Specific Gravity, UA: 1.02 (ref 1.005–1.030)
UUROB: 0.2 mg/dL (ref 0.2–1.0)

## 2017-09-11 NOTE — Progress Notes (Signed)
Subjective:  Patient ID: Nicolas Ho, male    DOB: August 24, 1961  Age: 56 y.o. MRN: [id number]  CC: Annual Exam (CPE) and Hyperlipidemia   HPI Nicolas Ho presents for complete physical examination.  He states that both his brother and father have had heart attacks.  He is taking red rice yeast for his cholesterol.  He is concerned about some puffiness in the right it is chronic.  He is using an antiallergy drop daily that helps just a little bit.  It does not reduce the puffiness but it has reduced the crusting.  This is been going on for a year or more.  Depression screen [facility] 2/9 09/11/2017 02/09/2016 05/14/2014  Decreased Interest 0 0 0  Down, Depressed, Hopeless 0 0 0  PHQ - 2 Score 0 0 0    History Nicolas Ho has a past medical history of Allergy, GERD (gastroesophageal reflux disease), Hyperlipidemia, and Kidney stones.   He has a past surgical history that includes Lithotripsy.   His family history includes Cancer in his brother and father; Diabetes in his mother; Heart disease in his brother; Hyperlipidemia in his sister.He reports that he quit smoking about 34 years ago. His smoking use included cigarettes. he has never used smokeless tobacco. He reports that he drinks alcohol. He reports that he does not use drugs.    ROS Review of Systems  Constitutional: Negative for activity change, appetite change, chills, diaphoresis, fatigue, fever and unexpected weight change.  HENT: Negative for congestion, ear pain, hearing loss, postnasal drip, rhinorrhea, sore throat, tinnitus and trouble swallowing.   Eyes: Negative for photophobia, pain, discharge and redness.  Respiratory: Negative for apnea, cough, choking, chest tightness, shortness of breath, wheezing and stridor.   Cardiovascular: Negative for chest pain, palpitations and leg swelling.  Gastrointestinal: Negative for abdominal distention, abdominal pain, blood in stool, constipation, diarrhea, nausea and vomiting.  Endocrine:  Negative for cold intolerance, heat intolerance, polydipsia, polyphagia and polyuria.  Genitourinary: Negative for difficulty urinating, dysuria, enuresis, flank pain, frequency, genital sores, hematuria and urgency.  Musculoskeletal: Negative for arthralgias and joint swelling.  Skin: Negative for color change, rash and wound.  Allergic/Immunologic: Negative for immunocompromised state.  Neurological: Negative for dizziness, tremors, seizures, syncope, facial asymmetry, speech difficulty, weakness, light-headedness, numbness and headaches.  Hematological: Does not bruise/bleed easily.  Psychiatric/Behavioral: Negative for agitation, behavioral problems, confusion, decreased concentration, dysphoric mood, hallucinations, sleep disturbance and suicidal ideas. The patient is not nervous/anxious and is not hyperactive.     Objective:  BP 134/80 (BP Location: Left Arm)   Pulse 76   Temp 97.9 F (36.6 C) (Oral)   Ht '5\' 8"'  (1.727 m)   Wt 167 lb (75.8 kg)   BMI 25.39 kg/m   BP Readings from Last 3 Encounters:  09/11/17 134/80  02/09/16 (!) 141/88  08/06/14 135/84    Wt Readings from Last 3 Encounters:  09/11/17 167 lb (75.8 kg)  02/09/16 163 lb 12.8 oz (74.3 kg)  08/06/14 162 lb 12.8 oz (73.8 kg)     Physical Exam  Constitutional: He is oriented to person, place, and time. He appears well-developed and well-nourished.  HENT:  Head: Normocephalic and atraumatic.  Mouth/Throat: Oropharynx is clear and moist.  Eyes: EOM are normal. Pupils are equal, round, and reactive to light.  Neck: Normal range of motion. No tracheal deviation present. No thyromegaly present.  Cardiovascular: Normal rate, regular rhythm and normal heart sounds. Exam reveals no gallop and no friction rub.  No murmur  heard. Pulmonary/Chest: Breath sounds normal. He has no wheezes. He has no rales.  Abdominal: Soft. He exhibits no mass. There is no tenderness.  Musculoskeletal: Normal range of motion. He exhibits  no edema.  Neurological: He is alert and oriented to person, place, and time.  Skin: Skin is warm and dry.  Psychiatric: He has a normal mood and affect.      Assessment & Plan:   Nicolas Ho was seen today for annual exam and hyperlipidemia.  Diagnoses and all orders for this visit:  Annual physical exam -     CBC with Differential/Platelet -     CMP14+EGFR -     Lipid panel -     Urinalysis -     PSA, total and free -     VITAMIN D 25 Hydroxy (Vit-D Deficiency, Fractures) -     Ambulatory referral to Ophthalmology  Pure hypercholesterolemia -     CMP14+EGFR -     Lipid panel  Vitamin D deficiency  Stenosis of lacrimal duct, unspecified laterality -     Ambulatory referral to Ophthalmology       I have discontinued Nicolas Ho's simvastatin and trimethoprim-polymyxin b. I am also having him maintain his cholecalciferol, FLUARIX QUADRIVALENT, and [place].  Allergies as of 09/11/2017   No Known Allergies     Medication List        Accurate as of 09/11/17 11:21 AM. Always use your most recent med list.          cholecalciferol 1000 units tablet Commonly known as:  VITAMIN D Take 1,000 Units by mouth daily.   FLUARIX QUADRIVALENT 0.5 ML injection Generic drug:  Influenza vac split quadrivalent PF inject 0.5 milliliter intramuscularly   SHINGRIX injection Generic drug:  Zoster Vaccine Adjuvanted inject 0.5 milliliter intramuscularly        Follow-up: Return in about 1 year (around 09/11/2018).  Claretta Fraise, M.D.

## 2017-09-11 NOTE — Patient Instructions (Signed)
Continue current medications. Continue good therapeutic lifestyle changes which include good diet and exercise. Fall precautions discussed with patient. If an FOBT was given today- please return it to our front desk. If you are over 57 years old - you may need Prevnar 13 or the adult Pneumonia vaccine.  **Flu shots are available--- please call and schedule a FLU-CLINIC appointment**  After your visit with us today you will receive a survey in the mail or online from [employer] regarding your care with us. Please take a moment to fill this out. Your feedback is very important to us as you can help us better understand your patient needs as well as improve your experience and satisfaction. WE CARE ABOUT YOU!!!    

## 2017-09-12 LAB — CBC WITH DIFFERENTIAL/PLATELET
BASOS: 1 %
Basophils Absolute: 0 10*3/uL (ref 0.0–0.2)
EOS (ABSOLUTE): 0.2 10*3/uL (ref 0.0–0.4)
Eos: 3 %
HEMATOCRIT: 42 % (ref 37.5–51.0)
Hemoglobin: 14.4 g/dL (ref 13.0–17.7)
IMMATURE GRANS (ABS): 0 10*3/uL (ref 0.0–0.1)
IMMATURE GRANULOCYTES: 0 %
LYMPHS ABS: 1.8 10*3/uL (ref 0.7–3.1)
Lymphs: 22 %
MCH: 29.7 pg (ref 26.6–33.0)
MCHC: 34.3 g/dL (ref 31.5–35.7)
MCV: 87 fL (ref 79–97)
MONOS ABS: 0.8 10*3/uL (ref 0.1–0.9)
Monocytes: 10 %
NEUTROS ABS: 5.2 10*3/uL (ref 1.4–7.0)
NEUTROS PCT: 64 %
PLATELETS: 289 10*3/uL (ref 150–379)
RBC: 4.85 x10E6/uL (ref 4.14–5.80)
RDW: 15 % (ref 12.3–15.4)
WBC: 8 10*3/uL (ref 3.4–10.8)

## 2017-09-12 LAB — LIPID PANEL
Chol/HDL Ratio: 5.4 ratio — ABNORMAL HIGH (ref 0.0–5.0)
Cholesterol, Total: 258 mg/dL — ABNORMAL HIGH (ref 100–199)
HDL: 48 mg/dL (ref >39)
LDL CALC: 149 mg/dL — AB (ref 0–99)
Triglycerides: 307 mg/dL — ABNORMAL HIGH (ref 0–149)
VLDL CHOLESTEROL CAL: 61 mg/dL — AB (ref 5–40)

## 2017-09-12 LAB — CMP14+EGFR
A/G RATIO: 2.1 (ref 1.2–2.2)
ALT: 24 IU/L (ref 0–44)
AST: 19 IU/L (ref 0–40)
Albumin: 4.8 g/dL (ref 3.5–5.5)
Alkaline Phosphatase: 51 IU/L (ref 39–117)
BUN/Creatinine Ratio: 12 (ref 9–20)
BUN: 12 mg/dL (ref 6–24)
Bilirubin Total: 0.3 mg/dL (ref 0.0–1.2)
CALCIUM: 9.6 mg/dL (ref 8.7–10.2)
CO2: 25 mmol/L (ref 20–29)
CREATININE: 1.03 mg/dL (ref 0.76–1.27)
Chloride: 101 mmol/L (ref 96–106)
GFR calc Af Amer: 93 mL/min/{1.73_m2} (ref >59)
GFR, EST NON AFRICAN AMERICAN: 80 mL/min/{1.73_m2} (ref >59)
Globulin, Total: 2.3 g/dL (ref 1.5–4.5)
Glucose: 88 mg/dL (ref 65–99)
POTASSIUM: 4.4 mmol/L (ref 3.5–5.2)
Sodium: 140 mmol/L (ref 134–144)
TOTAL PROTEIN: 7.1 g/dL (ref 6.0–8.5)

## 2017-09-12 LAB — VITAMIN D 25 HYDROXY (VIT D DEFICIENCY, FRACTURES): Vit D, 25-Hydroxy: 28.2 ng/mL — ABNORMAL LOW (ref 30.0–100.0)

## 2017-09-12 LAB — PSA, TOTAL AND FREE
PSA FREE PCT: 36.4 %
PSA FREE: 0.4 ng/mL
Prostate Specific Ag, Serum: 1.1 ng/mL (ref 0.0–4.0)

## 2017-09-14 ENCOUNTER — Telehealth: Payer: Self-pay | Admitting: Family Medicine

## 2017-09-15 ENCOUNTER — Other Ambulatory Visit: Payer: Self-pay | Admitting: *Deleted

## 2017-09-15 MED ORDER — VITAMIN D (ERGOCALCIFEROL) 1.25 MG (50000 UNIT) PO CAPS: 50000.0000 [IU] | ORAL_CAPSULE | ORAL | 0 refills | Status: DC

## 2017-09-15 NOTE — Telephone Encounter (Signed)
Aware of results. 

## 2017-09-22 ENCOUNTER — Telehealth: Payer: Self-pay | Admitting: Family Medicine

## 2017-09-26 ENCOUNTER — Other Ambulatory Visit: Payer: Self-pay | Admitting: Family Medicine

## 2017-09-26 MED ORDER — SERTRALINE HCL 50 MG PO TABS: 50.0000 mg | ORAL_TABLET | Freq: Every day | ORAL | 1 refills | Status: DC

## 2017-10-11 ENCOUNTER — Telehealth: Payer: Self-pay | Admitting: Family Medicine

## 2017-10-11 MED ORDER — SIMVASTATIN 10 MG PO TABS: 10.0000 mg | ORAL_TABLET | Freq: Every day | ORAL | 3 refills | Status: DC

## 2017-10-11 NOTE — Telephone Encounter (Signed)
Please contact the patient.

## 2017-10-11 NOTE — Telephone Encounter (Signed)
Zocor sent into pharmacy and pt is aware.

## 2017-10-11 NOTE — Telephone Encounter (Signed)
Patient states that he was supposed to call back and let us know if he wanted to be started on cholesterol medication. Patient called and said that he wanted to be started on Zocor. Zoloft 50mg  was sent to the pharmacy 09/26/17- patient is wanting to know why the zoloft was sent and why was no cholesterol medication sent in?

## 2017-12-14 ENCOUNTER — Other Ambulatory Visit: Payer: Self-pay | Admitting: Family Medicine

## 2017-12-15 NOTE — Telephone Encounter (Signed)
Last Vit D 09/11/17  28.2

## 2018-09-26 ENCOUNTER — Ambulatory Visit (INDEPENDENT_AMBULATORY_CARE_PROVIDER_SITE_OTHER): Payer: BC Managed Care – PPO | Admitting: Family Medicine

## 2018-09-26 ENCOUNTER — Encounter: Payer: Self-pay | Admitting: Family Medicine

## 2018-09-26 VITALS — BP 116/69 | HR 69 | Temp 97.8°F | Ht 68.0 in | Wt 161.8 lb

## 2018-09-26 DIAGNOSIS — Z0000 Encounter for general adult medical examination without abnormal findings: Secondary | ICD-10-CM | POA: Diagnosis not present

## 2018-09-26 LAB — URINALYSIS
BILIRUBIN UA: NEGATIVE
GLUCOSE, UA: NEGATIVE
KETONES UA: NEGATIVE
LEUKOCYTES UA: NEGATIVE
Nitrite, UA: NEGATIVE
PH UA: 6 (ref 5.0–7.5)
PROTEIN UA: NEGATIVE
SPEC GRAV UA: 1.02 (ref 1.005–1.030)
Urobilinogen, Ur: 0.2 mg/dL (ref 0.2–1.0)

## 2018-09-26 MED ORDER — SIMVASTATIN 10 MG PO TABS: 10.0000 mg | ORAL_TABLET | Freq: Every day | ORAL | 3 refills | Status: DC

## 2018-09-26 MED ORDER — OLOPATADINE HCL 0.1 % OP SOLN: 1.0000 [drp] | Freq: Two times a day (BID) | OPHTHALMIC | 12 refills | Status: DC

## 2018-09-26 NOTE — Progress Notes (Signed)
Subjective:  Patient ID: Nicolas Ho, male    DOB: 10-09-1961  Age: 57 y.o. MRN: [id number]  CC: Annual Exam   HPI ABDIAS HICKAM presents for CPE. No new concerns expressed.Some allergic conjunctivitis persists. No relief with systane or restasis.  Depression screen [facility] 2/9 09/26/2018 09/11/2017 02/09/2016  Decreased Interest 0 0 0  Down, Depressed, Hopeless 0 0 0  PHQ - 2 Score 0 0 0    History Shaka has a past medical history of Allergy, GERD (gastroesophageal reflux disease), Hyperlipidemia, and Kidney stones.   He has a past surgical history that includes Lithotripsy.   His family history includes Cancer in his brother and father; Diabetes in his mother; Heart disease in his brother; Hyperlipidemia in his sister.He reports that he quit smoking about 35 years ago. His smoking use included cigarettes. He has never used smokeless tobacco. He reports that he drinks alcohol. He reports that he does not use drugs.    ROS Review of Systems  Constitutional: Negative for activity change, fatigue and unexpected weight change.  HENT: Negative for congestion, ear pain, hearing loss, postnasal drip and trouble swallowing.   Eyes: Negative for pain and visual disturbance.  Respiratory: Negative for cough, chest tightness and shortness of breath.   Cardiovascular: Negative for chest pain, palpitations and leg swelling.  Gastrointestinal: Negative for abdominal distention, abdominal pain, blood in stool, constipation, diarrhea, nausea and vomiting.  Endocrine: Negative for cold intolerance, heat intolerance and polydipsia.  Genitourinary: Negative for difficulty urinating, dysuria, flank pain, frequency and urgency.  Musculoskeletal: Negative for arthralgias and joint swelling.  Skin: Negative for color change, rash and wound.  Neurological: Negative for dizziness, syncope, speech difficulty, weakness, light-headedness, numbness and headaches.  Hematological: Does not bruise/bleed  easily.  Psychiatric/Behavioral: Negative for confusion, decreased concentration, dysphoric mood and sleep disturbance. The patient is not nervous/anxious.     Objective:  BP 116/69   Pulse 69   Temp 97.8 F (36.6 C)   Ht _0  (1.727 m)   Wt 161 lb 12.8 oz (73.4 kg)   BMI 24.60 kg/m   BP Readings from Last 3 Encounters:  09/26/18 116/69  09/11/17 134/80  02/09/16 (!) 141/88    Wt Readings from Last 3 Encounters:  09/26/18 161 lb 12.8 oz (73.4 kg)  09/11/17 167 lb (75.8 kg)  02/09/16 163 lb 12.8 oz (74.3 kg)     Physical Exam  Constitutional: He is oriented to person, place, and time. He appears well-developed and well-nourished.  HENT:  Head: Normocephalic and atraumatic.  Mouth/Throat: Oropharynx is clear and moist.  Eyes: Pupils are equal, round, and reactive to light. EOM are normal.  Neck: Normal range of motion. No tracheal deviation present. No thyromegaly present.  Cardiovascular: Normal rate, regular rhythm and normal heart sounds. Exam reveals no gallop and no friction rub.  No murmur heard. Pulmonary/Chest: Breath sounds normal. He has no wheezes. He has no rales.  Abdominal: Soft. Bowel sounds are normal. He exhibits no distension and no mass. There is no tenderness. Hernia confirmed negative in the right inguinal area and confirmed negative in the left inguinal area.  Genitourinary: Testes normal and penis normal.  Musculoskeletal: Normal range of motion. He exhibits no edema.  Lymphadenopathy:    He has no cervical adenopathy.  Neurological: He is alert and oriented to person, place, and time.  Skin: Skin is warm and dry.  Psychiatric: He has a normal mood and affect.      Assessment &  Plan:   Mattias was seen today for annual exam.  Diagnoses and all orders for this visit:  Well adult exam -     CMP14+EGFR -     CBC with Differential/Platelet -     Lipid panel -     Urinalysis -     PSA -     VITAMIN D 25 Hydroxy (Vit-D Deficiency,  Fractures)  Other orders -     simvastatin (ZOCOR) 10 MG tablet; Take 1 tablet (10 mg total) by mouth at bedtime. -     olopatadine (PATANOL) 0.1 % ophthalmic solution; Place 1 drop into both eyes 2 (two) times daily.       I have discontinued Slayde A. Penman's cholecalciferol, FLUARIX QUADRIVALENT, SHINGRIX, and Vitamin D (Ergocalciferol). I am also having him start on olopatadine. Additionally, I am having him maintain his simvastatin.  Allergies as of 09/26/2018   No Known Allergies     Medication List        Accurate as of 09/26/18 11:05 AM. Always use your most recent med list.          olopatadine 0.1 % ophthalmic solution Commonly known as:  PATANOL Place 1 drop into both eyes 2 (two) times daily.   simvastatin 10 MG tablet Commonly known as:  ZOCOR Take 1 tablet (10 mg total) by mouth at bedtime.      Reviewed need for regular exercise and sensible diet. Drink more water too.  Follow-up: Return in about 1 year (around 09/27/2019).  Claretta Fraise, M.D.

## 2018-09-27 LAB — CBC WITH DIFFERENTIAL/PLATELET
Basophils Absolute: 0.1 10*3/uL (ref 0.0–0.2)
Basos: 1 %
EOS (ABSOLUTE): 0.2 10*3/uL (ref 0.0–0.4)
EOS: 3 %
HEMATOCRIT: 39.5 % (ref 37.5–51.0)
HEMOGLOBIN: 13.2 g/dL (ref 13.0–17.7)
IMMATURE GRANULOCYTES: 0 %
Immature Grans (Abs): 0 10*3/uL (ref 0.0–0.1)
Lymphocytes Absolute: 1.6 10*3/uL (ref 0.7–3.1)
Lymphs: 24 %
MCH: 29.3 pg (ref 26.6–33.0)
MCHC: 33.4 g/dL (ref 31.5–35.7)
MCV: 88 fL (ref 79–97)
MONOS ABS: 0.6 10*3/uL (ref 0.1–0.9)
Monocytes: 9 %
NEUTROS PCT: 63 %
Neutrophils Absolute: 4.4 10*3/uL (ref 1.4–7.0)
Platelets: 300 10*3/uL (ref 150–450)
RBC: 4.5 x10E6/uL (ref 4.14–5.80)
RDW: 13.7 % (ref 12.3–15.4)
WBC: 6.8 10*3/uL (ref 3.4–10.8)

## 2018-09-27 LAB — CMP14+EGFR
ALK PHOS: 48 IU/L (ref 39–117)
ALT: 23 IU/L (ref 0–44)
AST: 18 IU/L (ref 0–40)
Albumin/Globulin Ratio: 2.7 — ABNORMAL HIGH (ref 1.2–2.2)
Albumin: 4.9 g/dL (ref 3.5–5.5)
BUN/Creatinine Ratio: 11 (ref 9–20)
BUN: 12 mg/dL (ref 6–24)
Bilirubin Total: 0.5 mg/dL (ref 0.0–1.2)
CO2: 23 mmol/L (ref 20–29)
CREATININE: 1.05 mg/dL (ref 0.76–1.27)
Calcium: 9.7 mg/dL (ref 8.7–10.2)
Chloride: 101 mmol/L (ref 96–106)
GFR calc Af Amer: 90 mL/min/{1.73_m2} (ref >59)
GFR calc non Af Amer: 78 mL/min/{1.73_m2} (ref >59)
GLOBULIN, TOTAL: 1.8 g/dL (ref 1.5–4.5)
Glucose: 86 mg/dL (ref 65–99)
POTASSIUM: 4.4 mmol/L (ref 3.5–5.2)
SODIUM: 140 mmol/L (ref 134–144)
Total Protein: 6.7 g/dL (ref 6.0–8.5)

## 2018-09-27 LAB — PSA: PROSTATE SPECIFIC AG, SERUM: 0.8 ng/mL (ref 0.0–4.0)

## 2018-09-27 LAB — LIPID PANEL
CHOL/HDL RATIO: 3.3 ratio (ref 0.0–5.0)
Cholesterol, Total: 179 mg/dL (ref 100–199)
HDL: 54 mg/dL (ref >39)
LDL CALC: 105 mg/dL — AB (ref 0–99)
Triglycerides: 98 mg/dL (ref 0–149)
VLDL CHOLESTEROL CAL: 20 mg/dL (ref 5–40)

## 2018-09-27 LAB — VITAMIN D 25 HYDROXY (VIT D DEFICIENCY, FRACTURES): Vit D, 25-Hydroxy: 32.5 ng/mL (ref 30.0–100.0)

## 2018-09-28 NOTE — Progress Notes (Signed)
Hello Yadier,  Your lab result is normal.Some minor variations that are not significant are commonly marked abnormal, but do not represent any medical problem for you.  Best regards, Darryle Dennie, M.D.

## 2019-11-18 ENCOUNTER — Other Ambulatory Visit: Payer: Self-pay | Admitting: Family Medicine

## 2020-02-03 ENCOUNTER — Other Ambulatory Visit: Payer: Self-pay

## 2020-02-03 ENCOUNTER — Other Ambulatory Visit: Payer: Self-pay | Admitting: Family Medicine

## 2020-02-03 ENCOUNTER — Encounter: Payer: BC Managed Care – PPO | Admitting: Family Medicine

## 2020-02-03 ENCOUNTER — Other Ambulatory Visit: Payer: BC Managed Care – PPO

## 2020-02-03 DIAGNOSIS — Z0000 Encounter for general adult medical examination without abnormal findings: Secondary | ICD-10-CM

## 2020-02-03 DIAGNOSIS — E559 Vitamin D deficiency, unspecified: Secondary | ICD-10-CM

## 2020-02-03 DIAGNOSIS — Z125 Encounter for screening for malignant neoplasm of prostate: Secondary | ICD-10-CM

## 2020-02-03 DIAGNOSIS — E782 Mixed hyperlipidemia: Secondary | ICD-10-CM

## 2020-02-04 LAB — CBC WITH DIFFERENTIAL/PLATELET
Basophils Absolute: 0.1 10*3/uL (ref 0.0–0.2)
Basos: 1 %
EOS (ABSOLUTE): 0.2 10*3/uL (ref 0.0–0.4)
Eos: 3 %
Hematocrit: 39.4 % (ref 37.5–51.0)
Hemoglobin: 12.7 g/dL — ABNORMAL LOW (ref 13.0–17.7)
Immature Grans (Abs): 0 10*3/uL (ref 0.0–0.1)
Immature Granulocytes: 0 %
Lymphocytes Absolute: 2.6 10*3/uL (ref 0.7–3.1)
Lymphs: 31 %
MCH: 30.1 pg (ref 26.6–33.0)
MCHC: 32.2 g/dL (ref 31.5–35.7)
MCV: 93 fL (ref 79–97)
Monocytes Absolute: 0.7 10*3/uL (ref 0.1–0.9)
Monocytes: 8 %
Neutrophils Absolute: 4.7 10*3/uL (ref 1.4–7.0)
Neutrophils: 57 %
Platelets: 306 10*3/uL (ref 150–450)
RBC: 4.22 x10E6/uL (ref 4.14–5.80)
RDW: 13.4 % (ref 11.6–15.4)
WBC: 8.3 10*3/uL (ref 3.4–10.8)

## 2020-02-04 LAB — LIPID PANEL
Chol/HDL Ratio: 3.8 ratio (ref 0.0–5.0)
Cholesterol, Total: 178 mg/dL (ref 100–199)
HDL: 47 mg/dL (ref >39)
LDL Chol Calc (NIH): 97 mg/dL (ref 0–99)
Triglycerides: 196 mg/dL — ABNORMAL HIGH (ref 0–149)
VLDL Cholesterol Cal: 34 mg/dL (ref 5–40)

## 2020-02-04 LAB — CMP14+EGFR
ALT: 20 IU/L (ref 0–44)
AST: 21 IU/L (ref 0–40)
Albumin/Globulin Ratio: 2.2 (ref 1.2–2.2)
Albumin: 4.4 g/dL (ref 3.8–4.9)
Alkaline Phosphatase: 55 IU/L (ref 39–117)
BUN/Creatinine Ratio: 11 (ref 9–20)
BUN: 13 mg/dL (ref 6–24)
Bilirubin Total: <0.2 mg/dL (ref 0.0–1.2)
CO2: 21 mmol/L (ref 20–29)
Calcium: 9 mg/dL (ref 8.7–10.2)
Chloride: 105 mmol/L (ref 96–106)
Creatinine, Ser: 1.15 mg/dL (ref 0.76–1.27)
GFR calc Af Amer: 80 mL/min/{1.73_m2} (ref >59)
GFR calc non Af Amer: 69 mL/min/{1.73_m2} (ref >59)
Globulin, Total: 2 g/dL (ref 1.5–4.5)
Glucose: 104 mg/dL — ABNORMAL HIGH (ref 65–99)
Potassium: 4.2 mmol/L (ref 3.5–5.2)
Sodium: 141 mmol/L (ref 134–144)
Total Protein: 6.4 g/dL (ref 6.0–8.5)

## 2020-02-04 LAB — PSA TOTAL (REFLEX TO FREE): Prostate Specific Ag, Serum: 0.8 ng/mL (ref 0.0–4.0)

## 2020-02-04 LAB — VITAMIN D 25 HYDROXY (VIT D DEFICIENCY, FRACTURES): Vit D, 25-Hydroxy: 39.9 ng/mL (ref 30.0–100.0)

## 2020-02-06 ENCOUNTER — Ambulatory Visit (INDEPENDENT_AMBULATORY_CARE_PROVIDER_SITE_OTHER): Payer: BC Managed Care – PPO | Admitting: Family Medicine

## 2020-02-06 ENCOUNTER — Encounter: Payer: Self-pay | Admitting: Family Medicine

## 2020-02-06 ENCOUNTER — Other Ambulatory Visit: Payer: Self-pay

## 2020-02-06 VITALS — BP 137/86 | HR 81 | Temp 98.1°F | Ht 68.0 in | Wt 173.2 lb

## 2020-02-06 DIAGNOSIS — Z0001 Encounter for general adult medical examination with abnormal findings: Secondary | ICD-10-CM

## 2020-02-06 DIAGNOSIS — R072 Precordial pain: Secondary | ICD-10-CM

## 2020-02-06 DIAGNOSIS — Z0000 Encounter for general adult medical examination without abnormal findings: Secondary | ICD-10-CM

## 2020-02-06 NOTE — Progress Notes (Signed)
Subjective:  Patient ID: Nicolas Ho, male    DOB: 1961/10/08  Age: 59 y.o. MRN: [id number]  CC: Annual Exam   HPI [name] presents for annual physical exam.  He mentions that he is having episodic chest pain with exertion.  No radiation.  No nausea or shortness of breath.  It is not occurring at rest.  He relates that his father had a heart attack in his 7s and his brother who was a heavy smoker and drank alcohol died at age 54 of a heart attack.  Precordial chest pain  Depression screen [facility] 2/9 02/06/2020 09/26/2018 09/11/2017  Decreased Interest 0 0 0  Down, Depressed, Hopeless 0 0 0  PHQ - 2 Score 0 0 0    History [name] has a past medical history of Allergy, GERD (gastroesophageal reflux disease), Hyperlipidemia, and Kidney stones.   He has a past surgical history that includes Lithotripsy.   His family history includes Cancer in his brother and father; Diabetes in his mother; Heart disease in his brother; Hyperlipidemia in his sister.He reports that he quit smoking about 36 years ago. His smoking use included cigarettes. He has never used smokeless tobacco. He reports current alcohol use. He reports that he does not use drugs.    ROS Review of Systems  Constitutional: Negative for activity change, fatigue and unexpected weight change.  HENT: Negative for congestion, ear pain, hearing loss, postnasal drip and trouble swallowing.   Eyes: Negative for pain and visual disturbance.  Respiratory: Negative for cough, chest tightness and shortness of breath.   Cardiovascular: Negative for chest pain, palpitations and leg swelling.  Gastrointestinal: Negative for abdominal distention, abdominal pain, blood in stool, constipation, diarrhea, nausea and vomiting.  Endocrine: Negative for cold intolerance, heat intolerance and polydipsia.  Genitourinary: Negative for difficulty urinating, dysuria, flank pain, frequency and urgency.  Musculoskeletal: Negative for arthralgias and  joint swelling.  Skin: Negative for color change, rash and wound.  Neurological: Negative for dizziness, syncope, speech difficulty, weakness, light-headedness, numbness and headaches.  Hematological: Does not bruise/bleed easily.  Psychiatric/Behavioral: Negative for confusion, decreased concentration, dysphoric mood and sleep disturbance. The patient is not nervous/anxious.     Objective:  BP 137/86   Pulse 81   Temp 98.1 F (36.7 C) (Temporal)   Ht 5\' 8"  (1.727 m)   Wt 173 lb 3.2 oz (78.6 kg)   BMI 26.33 kg/m   BP Readings from Last 3 Encounters:  02/06/20 137/86  09/26/18 116/69  09/11/17 134/80    Wt Readings from Last 3 Encounters:  02/06/20 173 lb 3.2 oz (78.6 kg)  09/26/18 161 lb 12.8 oz (73.4 kg)  09/11/17 167 lb (75.8 kg)     Physical Exam Constitutional:      Appearance: He is well-developed.  HENT:     Head: Normocephalic and atraumatic.  Eyes:     Pupils: Pupils are equal, round, and reactive to light.  Neck:     Thyroid: No thyromegaly.     Trachea: No tracheal deviation.  Cardiovascular:     Rate and Rhythm: Normal rate and regular rhythm.     Heart sounds: Normal heart sounds. No murmur. No friction rub. No gallop.   Pulmonary:     Breath sounds: Normal breath sounds. No wheezing or rales.  Abdominal:     General: Bowel sounds are normal. There is no distension.     Palpations: Abdomen is soft. There is no mass.     Tenderness: There is  no abdominal tenderness.     Hernia: There is no hernia in the left inguinal area.  Genitourinary:    Penis: Normal.      Testes: Normal.  Musculoskeletal:        General: Normal range of motion.     Cervical back: Normal range of motion.  Lymphadenopathy:     Cervical: No cervical adenopathy.  Skin:    General: Skin is warm and dry.  Neurological:     Mental Status: He is alert and oriented to person, place, and time.       Assessment & Plan:   Manny was seen today for annual exam.  Diagnoses and  all orders for this visit:  Well adult exam  Precordial chest pain   Patient is reluctant to consider angina as a possibility for his chest pain.  Significant discussion led to him agreeing to consider a stress test later in the summer.  He was encouraged that if the symptoms became more frequent that he should follow-up sooner.   I have discontinued Saladin A. Mun's olopatadine. I am also having him maintain his simvastatin and cholecalciferol.  Allergies as of 02/06/2020   No Known Allergies     Medication List       Accurate as of February 06, 2020 12:08 PM. If you have any questions, ask your nurse or doctor.        STOP taking these medications   olopatadine 0.1 % ophthalmic solution Commonly known as: PATANOL Stopped by: Claretta Fraise, MD     TAKE these medications   cholecalciferol 25 MCG (1000 UNIT) tablet Commonly known as: VITAMIN D3 Take 1,000 Units by mouth daily.   simvastatin 10 MG tablet Commonly known as: ZOCOR TAKE 1 TABLET BY MOUTH AT BEDTIME        Follow-up: No follow-ups on file.  Claretta Fraise, M.D.

## 2020-05-01 ENCOUNTER — Other Ambulatory Visit: Payer: Self-pay | Admitting: Family Medicine

## 2020-10-21 ENCOUNTER — Other Ambulatory Visit: Payer: Self-pay | Admitting: Family Medicine

## 2021-02-15 ENCOUNTER — Other Ambulatory Visit: Payer: Self-pay

## 2021-02-15 ENCOUNTER — Encounter: Payer: Self-pay | Admitting: Family Medicine

## 2021-02-15 ENCOUNTER — Ambulatory Visit (INDEPENDENT_AMBULATORY_CARE_PROVIDER_SITE_OTHER): Payer: BC Managed Care – PPO | Admitting: Family Medicine

## 2021-02-15 VITALS — BP 124/71 | HR 79 | Temp 97.6°F | Ht 68.0 in | Wt 163.6 lb

## 2021-02-15 DIAGNOSIS — Z114 Encounter for screening for human immunodeficiency virus [HIV]: Secondary | ICD-10-CM

## 2021-02-15 DIAGNOSIS — E559 Vitamin D deficiency, unspecified: Secondary | ICD-10-CM

## 2021-02-15 DIAGNOSIS — E7800 Pure hypercholesterolemia, unspecified: Secondary | ICD-10-CM | POA: Diagnosis not present

## 2021-02-15 DIAGNOSIS — J302 Other seasonal allergic rhinitis: Secondary | ICD-10-CM | POA: Diagnosis not present

## 2021-02-15 DIAGNOSIS — Z0000 Encounter for general adult medical examination without abnormal findings: Secondary | ICD-10-CM

## 2021-02-15 DIAGNOSIS — Z0001 Encounter for general adult medical examination with abnormal findings: Secondary | ICD-10-CM | POA: Diagnosis not present

## 2021-02-15 DIAGNOSIS — Z125 Encounter for screening for malignant neoplasm of prostate: Secondary | ICD-10-CM

## 2021-02-15 LAB — URINALYSIS
Bilirubin, UA: NEGATIVE
Glucose, UA: NEGATIVE
Ketones, UA: NEGATIVE
Leukocytes,UA: NEGATIVE
Nitrite, UA: NEGATIVE
Protein,UA: NEGATIVE
Specific Gravity, UA: 1.025 (ref 1.005–1.030)
Urobilinogen, Ur: 0.2 mg/dL (ref 0.2–1.0)
pH, UA: 5 (ref 5.0–7.5)

## 2021-02-15 MED ORDER — SIMVASTATIN 10 MG PO TABS: 10.0000 mg | ORAL_TABLET | Freq: Every day | ORAL | 1 refills | Status: DC

## 2021-02-15 NOTE — Progress Notes (Signed)
Subjective:  Patient ID: Nicolas Ho, male    DOB: [birth year]  Age: 60 y.o. MRN: [id number]  CC: Annual Exam   HPI Nicolas Ho presents for Annual PHysical  Depression screen [facility] 2/9 02/15/2021 02/06/2020 09/26/2018  Decreased Interest 0 0 0  Down, Depressed, Hopeless 0 0 0  PHQ - 2 Score 0 0 0    History Nicolas Ho has a past medical history of Allergy, GERD (gastroesophageal reflux disease), Hyperlipidemia, and Kidney stones.   He has a past surgical history that includes Lithotripsy.   His family history includes Cancer in his brother and father; Diabetes in his mother; Heart disease in his brother; Hyperlipidemia in his sister.He reports that he quit smoking about 37 years ago. His smoking use included cigarettes. He has never used smokeless tobacco. He reports current alcohol use. He reports that he does not use drugs.    ROS Review of Systems  Constitutional: Negative for activity change, fatigue and unexpected weight change.  HENT: Negative for congestion, ear pain, hearing loss, postnasal drip and trouble swallowing.   Eyes: Negative for pain and visual disturbance.  Respiratory: Positive for cough (Coughing up a litle sputum in the morning first thing for a week. ). Negative for chest tightness and shortness of breath.   Cardiovascular: Negative for chest pain, palpitations and leg swelling.  Gastrointestinal: Negative for abdominal distention, abdominal pain, blood in stool, constipation, diarrhea, nausea and vomiting.  Endocrine: Negative for cold intolerance, heat intolerance and polydipsia.  Genitourinary: Negative for difficulty urinating, dysuria, flank pain, frequency and urgency.  Musculoskeletal: Positive for arthralgias (wrists, right fifth PIP). Negative for joint swelling.  Skin: Negative for color change, rash and wound.  Neurological: Negative for dizziness, syncope, speech difficulty, weakness, light-headedness, numbness and headaches.  Hematological: Does  not bruise/bleed easily.  Psychiatric/Behavioral: Negative for confusion, decreased concentration, dysphoric mood and sleep disturbance. The patient is not nervous/anxious.     Objective:  BP 124/71   Pulse 79   Temp 97.6 F (36.4 C)   Ht '5\' 8"'  (1.727 m)   Wt 163 lb 9.6 oz (74.2 kg)   SpO2 100%   BMI 24.88 kg/m   BP Readings from Last 3 Encounters:  02/15/21 124/71  02/06/20 137/86  09/26/18 116/69    Wt Readings from Last 3 Encounters:  02/15/21 163 lb 9.6 oz (74.2 kg)  02/06/20 173 lb 3.2 oz (78.6 kg)  09/26/18 161 lb 12.8 oz (73.4 kg)     Physical Exam Constitutional:      Appearance: He is well-developed.  HENT:     Head: Normocephalic and atraumatic.  Eyes:     Pupils: Pupils are equal, round, and reactive to light.  Neck:     Thyroid: No thyromegaly.     Trachea: No tracheal deviation.  Cardiovascular:     Rate and Rhythm: Normal rate and regular rhythm.     Heart sounds: Normal heart sounds. No murmur heard. No friction rub. No gallop.   Pulmonary:     Breath sounds: Normal breath sounds. No wheezing or rales.  Abdominal:     General: Bowel sounds are normal. There is no distension.     Palpations: Abdomen is soft. There is no mass.     Tenderness: There is no abdominal tenderness.     Hernia: There is no hernia in the left inguinal area.  Genitourinary:    Penis: Normal.      Testes: Normal.  Musculoskeletal:  General: Normal range of motion.     Cervical back: Normal range of motion.  Lymphadenopathy:     Cervical: No cervical adenopathy.  Skin:    General: Skin is warm and dry.  Neurological:     Mental Status: He is alert and oriented to person, place, and time.       Assessment & Plan:   Nicolas Ho was seen today for annual exam.  Diagnoses and all orders for this visit:  Annual physical exam -     CBC with Differential/Platelet -     CMP14+EGFR -     Lipid panel -     VITAMIN D 25 Hydroxy (Vit-D Deficiency, Fractures) -      PSA, total and free -     Urinalysis  Seasonal allergic rhinitis, unspecified trigger -     CBC with Differential/Platelet  Pure hypercholesterolemia -     Lipid panel  Vitamin D deficiency -     VITAMIN D 25 Hydroxy (Vit-D Deficiency, Fractures)  Screening for prostate cancer -     PSA, total and free  Encounter for screening for HIV -     HIV Antibody (routine testing w rflx)  Other orders -     simvastatin (ZOCOR) 10 MG tablet; Take 1 tablet (10 mg total) by mouth at bedtime. (Needs to be seen before next refill)       I am having Nicolas Ho maintain his cholecalciferol and simvastatin.  Allergies as of 02/15/2021   No Known Allergies     Medication List       Accurate as of February 15, 2021  9:43 AM. If you have any questions, ask your nurse or doctor.        cholecalciferol 25 MCG (1000 UNIT) tablet Commonly known as: VITAMIN D3 Take 1,000 Units by mouth daily.   simvastatin 10 MG tablet Commonly known as: ZOCOR Take 1 tablet (10 mg total) by mouth at bedtime. (Needs to be seen before next refill)        Follow-up: Return in about 1 year (around 02/15/2022) for [facility] physical.  Claretta Fraise, M.D.

## 2021-02-16 LAB — CBC WITH DIFFERENTIAL/PLATELET
Basophils Absolute: 0.1 10*3/uL (ref 0.0–0.2)
Basos: 1 %
EOS (ABSOLUTE): 0.2 10*3/uL (ref 0.0–0.4)
Eos: 2 %
Hematocrit: 39.4 % (ref 37.5–51.0)
Hemoglobin: 13.4 g/dL (ref 13.0–17.7)
Immature Grans (Abs): 0 10*3/uL (ref 0.0–0.1)
Immature Granulocytes: 0 %
Lymphocytes Absolute: 1.9 10*3/uL (ref 0.7–3.1)
Lymphs: 23 %
MCH: 29.7 pg (ref 26.6–33.0)
MCHC: 34 g/dL (ref 31.5–35.7)
MCV: 87 fL (ref 79–97)
Monocytes Absolute: 0.6 10*3/uL (ref 0.1–0.9)
Monocytes: 8 %
Neutrophils Absolute: 5.2 10*3/uL (ref 1.4–7.0)
Neutrophils: 66 %
Platelets: 328 10*3/uL (ref 150–450)
RBC: 4.51 x10E6/uL (ref 4.14–5.80)
RDW: 13.2 % (ref 11.6–15.4)
WBC: 7.9 10*3/uL (ref 3.4–10.8)

## 2021-02-16 LAB — CMP14+EGFR
ALT: 22 IU/L (ref 0–44)
AST: 18 IU/L (ref 0–40)
Albumin/Globulin Ratio: 1.8 (ref 1.2–2.2)
Albumin: 4.7 g/dL (ref 3.8–4.9)
Alkaline Phosphatase: 61 IU/L (ref 44–121)
BUN/Creatinine Ratio: 15 (ref 10–24)
BUN: 16 mg/dL (ref 8–27)
Bilirubin Total: 0.3 mg/dL (ref 0.0–1.2)
CO2: 22 mmol/L (ref 20–29)
Calcium: 9.6 mg/dL (ref 8.6–10.2)
Chloride: 104 mmol/L (ref 96–106)
Creatinine, Ser: 1.05 mg/dL (ref 0.76–1.27)
Globulin, Total: 2.6 g/dL (ref 1.5–4.5)
Glucose: 95 mg/dL (ref 65–99)
Potassium: 5 mmol/L (ref 3.5–5.2)
Sodium: 142 mmol/L (ref 134–144)
Total Protein: 7.3 g/dL (ref 6.0–8.5)
eGFR: 81 mL/min/{1.73_m2} (ref >59)

## 2021-02-16 LAB — LIPID PANEL
Chol/HDL Ratio: 3.4 ratio (ref 0.0–5.0)
Cholesterol, Total: 175 mg/dL (ref 100–199)
HDL: 51 mg/dL (ref >39)
LDL Chol Calc (NIH): 107 mg/dL — ABNORMAL HIGH (ref 0–99)
Triglycerides: 90 mg/dL (ref 0–149)
VLDL Cholesterol Cal: 17 mg/dL (ref 5–40)

## 2021-02-16 LAB — PSA, TOTAL AND FREE
PSA, Free Pct: 30 %
PSA, Free: 0.48 ng/mL
Prostate Specific Ag, Serum: 1.6 ng/mL (ref 0.0–4.0)

## 2021-02-16 LAB — HIV ANTIBODY (ROUTINE TESTING W REFLEX): HIV Screen 4th Generation wRfx: NONREACTIVE

## 2021-02-16 LAB — VITAMIN D 25 HYDROXY (VIT D DEFICIENCY, FRACTURES): Vit D, 25-Hydroxy: 37 ng/mL (ref 30.0–100.0)

## 2021-02-16 NOTE — Progress Notes (Signed)
Hello Stevenson,  Your lab result is normal and/or stable.Some minor variations that are not significant are commonly marked abnormal, but do not represent any medical problem for you.  Best regards, Odai Wimmer, M.D.

## 2021-02-22 ENCOUNTER — Other Ambulatory Visit: Payer: Self-pay | Admitting: *Deleted

## 2021-02-22 DIAGNOSIS — R319 Hematuria, unspecified: Secondary | ICD-10-CM

## 2021-02-23 ENCOUNTER — Encounter: Payer: Self-pay | Admitting: *Deleted

## 2021-02-26 ENCOUNTER — Other Ambulatory Visit: Payer: Self-pay | Admitting: Family Medicine

## 2021-02-26 ENCOUNTER — Other Ambulatory Visit: Payer: BC Managed Care – PPO

## 2021-02-26 ENCOUNTER — Other Ambulatory Visit: Payer: Self-pay

## 2021-02-26 DIAGNOSIS — R319 Hematuria, unspecified: Secondary | ICD-10-CM

## 2021-02-26 LAB — MICROSCOPIC EXAMINATION: Epithelial Cells (non renal): NONE SEEN /hpf (ref 0–10)

## 2021-02-26 LAB — URINALYSIS, ROUTINE W REFLEX MICROSCOPIC
Bilirubin, UA: NEGATIVE
Glucose, UA: NEGATIVE
Ketones, UA: NEGATIVE
Nitrite, UA: NEGATIVE
Protein,UA: NEGATIVE
Specific Gravity, UA: 1.02 (ref 1.005–1.030)
Urobilinogen, Ur: 0.2 mg/dL (ref 0.2–1.0)
pH, UA: 7 (ref 5.0–7.5)

## 2021-03-01 LAB — URINE CULTURE: Organism ID, Bacteria: NO GROWTH

## 2021-03-02 NOTE — Progress Notes (Signed)
Hello Nicolas Ho,  Your lab result is normal and/or stable.Some minor variations that are not significant are commonly marked abnormal, but do not represent any medical problem for you.  Best regards, Flynt Breeze, M.D.

## 2021-04-21 ENCOUNTER — Ambulatory Visit: Payer: Self-pay | Admitting: Urology

## 2021-05-06 ENCOUNTER — Ambulatory Visit: Payer: Self-pay | Admitting: Urology

## 2021-09-27 ENCOUNTER — Ambulatory Visit (INDEPENDENT_AMBULATORY_CARE_PROVIDER_SITE_OTHER): Payer: BC Managed Care – PPO | Admitting: Family

## 2021-09-27 ENCOUNTER — Encounter: Payer: Self-pay | Admitting: Family

## 2021-09-27 VITALS — BP 137/81 | HR 98 | Temp 98.4°F | Ht 68.0 in | Wt 160.0 lb

## 2021-09-27 DIAGNOSIS — J0100 Acute maxillary sinusitis, unspecified: Secondary | ICD-10-CM | POA: Diagnosis not present

## 2021-09-27 MED ORDER — PREDNISONE 10 MG (21) PO TBPK: ORAL_TABLET | ORAL | 0 refills | Status: DC

## 2021-09-27 MED ORDER — AMOXICILLIN-POT CLAVULANATE 875-125 MG PO TABS: 1.0000 | ORAL_TABLET | Freq: Two times a day (BID) | ORAL | 0 refills | Status: DC

## 2021-09-27 NOTE — Progress Notes (Signed)
Subjective:    Patient ID: Nicolas Ho, male    DOB: August 24, 1961, 60 y.o.   MRN: [id number]  Chief Complaint  Patient presents with   Cough   Sinusitis    Has had for over 2 weeks    URI    Cough Associated symptoms include chills, ear pain (right) and headaches. Pertinent negatives include no sore throat.  Sinusitis This is a new problem. The problem has been gradually worsening since onset. His pain is at a severity of 5/10. The pain is moderate. Associated symptoms include chills, congestion, coughing, ear pain (right), headaches and sinus pressure. Pertinent negatives include no sneezing or sore throat. Past treatments include oral decongestants. The treatment provided mild relief.  URI  Associated symptoms include congestion, coughing, ear pain (right) and headaches. Pertinent negatives include no sneezing or sore throat.     Review of Systems  Constitutional:  Positive for chills.  HENT:  Positive for congestion, ear pain (right) and sinus pressure. Negative for sneezing and sore throat.   Respiratory:  Positive for cough.   Neurological:  Positive for headaches.  All other systems reviewed and are negative.     Objective:   Physical Exam Vitals reviewed.  Constitutional:      General: He is not in acute distress.    Appearance: He is well-developed.  HENT:     Head: Normocephalic.     Right Ear: Tympanic membrane normal.     Left Ear: Tympanic membrane normal.  Eyes:     General:        Right eye: No discharge.        Left eye: No discharge.     Pupils: Pupils are equal, round, and reactive to light.  Neck:     Thyroid: No thyromegaly.  Cardiovascular:     Rate and Rhythm: Normal rate and regular rhythm.     Heart sounds: Normal heart sounds. No murmur heard. Pulmonary:     Effort: Pulmonary effort is normal. No respiratory distress.     Breath sounds: Rhonchi present. No wheezing.  Abdominal:     General: Bowel sounds are normal. There is no  distension.     Palpations: Abdomen is soft.     Tenderness: There is no abdominal tenderness.  Musculoskeletal:        General: No tenderness. Normal range of motion.     Cervical back: Normal range of motion and neck supple.  Skin:    General: Skin is warm and dry.     Findings: No erythema or rash.  Neurological:     Mental Status: He is alert and oriented to person, place, and time.     Cranial Nerves: No cranial nerve deficit.     Deep Tendon Reflexes: Reflexes are normal and symmetric.  Psychiatric:        Behavior: Behavior normal.        Thought Content: Thought content normal.        Judgment: Judgment normal.     BP 137/81   Pulse 98   Temp 98.4 F (36.9 C) (Temporal)   Ht 5\' 8"  (1.727 m)   Wt 160 lb (72.6 kg)   SpO2 97%   BMI 24.33 kg/m       Assessment & Plan:  Nicolas Ho comes in today with chief complaint of Cough, Sinusitis (Has had for over 2 weeks ), and URI   Diagnosis and orders addressed:  1. Acute maxillary sinusitis, recurrence not specified -  Take meds as prescribed - Use a cool mist humidifier  -Use saline nose sprays frequently -Force fluids -For any cough or congestion  Use plain Mucinex- regular strength or max strength is fine -For fever or aces or pains- take tylenol or ibuprofen. -Throat lozenges if help -RTO if symptoms worsen or do not improve  - amoxicillin-clavulanate (AUGMENTIN) 875-125 MG tablet; Take 1 tablet by mouth 2 (two) times daily.  Dispense: 14 tablet; Refill: 0 - predniSONE (STERAPRED UNI-PAK 21 TAB) 10 MG (21) TBPK tablet; Use as directed  Dispense: 21 tablet; Refill: 0   Jannifer Rodney, FNP

## 2021-09-27 NOTE — Patient Instructions (Signed)
Acute Bronchitis, Adult °Acute bronchitis is sudden inflammation of the main airways (bronchi) that come off the windpipe (trachea) in the lungs. The swelling causes the airways to get smaller and make more mucus than normal. This can make it hard to breathe and can cause coughing or noisy breathing (wheezing). °Acute bronchitis may last several weeks. The cough may last longer. Allergies, asthma, and exposure to smoke may make the condition worse. °What are the causes? °This condition can be caused by germs and by substances that irritate the lungs, including: °Cold and flu viruses. The most common cause of this condition is the virus that causes the common cold. °Bacteria. This is less common. °Breathing in substances that irritate the lungs, including: °Smoke from cigarettes and other forms of tobacco. °Dust and pollen. °Fumes from household cleaning products, gases, or burned fuel. °Indoor or outdoor air pollution. °What increases the risk? °The following factors may make you more likely to develop this condition: °A weak body's defense system, also called the immune system. °A condition that affects your lungs and breathing, such as asthma. °What are the signs or symptoms? °Common symptoms of this condition include: °Coughing. This may bring up clear, yellow, or green mucus from your lungs (sputum). °Wheezing. °Runny or stuffy nose. °Having too much mucus in your lungs (chest congestion). °Shortness of breath. °Aches and pains, including sore throat or chest. °How is this diagnosed? °This condition is usually diagnosed based on: °Your symptoms and medical history. °A physical exam. °You may also have other tests, including tests to rule out other conditions, such as pneumonia. These tests include: °A test of lung function. °Test of a mucus sample to look for the presence of bacteria. °Tests to check the oxygen level in your blood. °Blood tests. °Chest X-ray. °How is this treated? °Most cases of acute bronchitis  clear up over time without treatment. Your health care provider may recommend: °Drinking more fluids to help thin your mucus so it is easier to cough up. °Taking inhaled medicine (inhaler) to improve air flow in and out of your lungs. °Using a vaporizer or a humidifier. These are machines that add water to the air to help you breathe better. °Taking a medicine that thins mucus and clears congestion (expectorant). °Taking a medicine that prevents or stops coughing (cough suppressant). °It is notcommon to take an antibiotic medicine for this condition. °Follow these instructions at home: ° °Take over-the-counter and prescription medicines only as told by your health care provider. °Use an inhaler, vaporizer, or humidifier as told by your health care provider. °Take two teaspoons (10 mL) of honey at bedtime to lessen coughing at night. °Drink enough fluid to keep your urine pale yellow. °Do not use any products that contain nicotine or tobacco. These products include cigarettes, chewing tobacco, and vaping devices, such as e-cigarettes. If you need help quitting, ask your health care provider. °Get plenty of rest. °Return to your normal activities as told by your health care provider. Ask your health care provider what activities are safe for you. °Keep all follow-up visits. This is important. °How is this prevented? °To lower your risk of getting this condition again: °Wash your hands often with soap and water for at least 20 seconds. If soap and water are not available, use hand sanitizer. °Avoid contact with people who have cold symptoms. °Try not to touch your mouth, nose, or eyes with your hands. °Avoid breathing in smoke or chemical fumes. Breathing smoke or chemical fumes will make your condition   worse. °Get the flu shot every year. °Contact a health care provider if: °Your symptoms do not improve after 2 weeks. °You have trouble coughing up the mucus. °Your cough keeps you awake at night. °You have a  fever. °Get help right away if you: °Cough up blood. °Feel pain in your chest. °Have severe shortness of breath. °Faint or keep feeling like you are going to faint. °Have a severe headache. °Have a fever or chills that get worse. °These symptoms may represent a serious problem that is an emergency. Do not wait to see if the symptoms will go away. Get medical help right away. Call your local emergency services (911 in the [place] Do not drive yourself to the hospital. °Summary °Acute bronchitis is inflammation of the main airways (bronchi) that come off the windpipe (trachea) in the lungs. The swelling causes the airways to get smaller and make more mucus than normal. °Drinking more fluids can help thin your mucus so it is easier to cough up. °Take over-the-counter and prescription medicines only as told by your health care provider. °Do not use any products that contain nicotine or tobacco. These products include cigarettes, chewing tobacco, and vaping devices, such as e-cigarettes. If you need help quitting, ask your health care provider. °Contact a health care provider if your symptoms do not improve after 2 weeks. °This information is not intended to replace advice given to you by your health care provider. Make sure you discuss any questions you have with your health care provider. °Document Revised: 02/17/2021 Document Reviewed: 02/17/2021 °Elsevier Patient Education © 2022 [employer] ° °

## 2022-02-12 ENCOUNTER — Other Ambulatory Visit: Payer: Self-pay | Admitting: Family Medicine

## 2022-04-27 ENCOUNTER — Other Ambulatory Visit: Payer: Self-pay | Admitting: Family Medicine

## 2022-06-07 ENCOUNTER — Telehealth: Payer: Self-pay | Admitting: Family Medicine

## 2022-06-07 ENCOUNTER — Other Ambulatory Visit: Payer: Self-pay | Admitting: *Deleted

## 2022-06-07 DIAGNOSIS — Z0000 Encounter for general adult medical examination without abnormal findings: Secondary | ICD-10-CM

## 2022-06-07 DIAGNOSIS — Z125 Encounter for screening for malignant neoplasm of prostate: Secondary | ICD-10-CM

## 2022-06-07 DIAGNOSIS — E7800 Pure hypercholesterolemia, unspecified: Secondary | ICD-10-CM

## 2022-06-07 DIAGNOSIS — E559 Vitamin D deficiency, unspecified: Secondary | ICD-10-CM

## 2022-06-07 NOTE — Telephone Encounter (Signed)
Labs ordered.

## 2022-06-08 ENCOUNTER — Other Ambulatory Visit: Payer: BC Managed Care – PPO

## 2022-06-08 DIAGNOSIS — E7800 Pure hypercholesterolemia, unspecified: Secondary | ICD-10-CM

## 2022-06-08 DIAGNOSIS — Z0000 Encounter for general adult medical examination without abnormal findings: Secondary | ICD-10-CM

## 2022-06-08 DIAGNOSIS — Z125 Encounter for screening for malignant neoplasm of prostate: Secondary | ICD-10-CM

## 2022-06-08 DIAGNOSIS — E559 Vitamin D deficiency, unspecified: Secondary | ICD-10-CM

## 2022-06-09 LAB — CMP14+EGFR
ALT: 29 IU/L (ref 0–44)
AST: 23 IU/L (ref 0–40)
Albumin/Globulin Ratio: 2.5 — ABNORMAL HIGH (ref 1.2–2.2)
Albumin: 4.7 g/dL (ref 3.9–4.9)
Alkaline Phosphatase: 62 IU/L (ref 44–121)
BUN/Creatinine Ratio: 15 (ref 10–24)
BUN: 16 mg/dL (ref 8–27)
Bilirubin Total: 0.5 mg/dL (ref 0.0–1.2)
CO2: 23 mmol/L (ref 20–29)
Calcium: 9.6 mg/dL (ref 8.6–10.2)
Chloride: 103 mmol/L (ref 96–106)
Creatinine, Ser: 1.1 mg/dL (ref 0.76–1.27)
Globulin, Total: 1.9 g/dL (ref 1.5–4.5)
Glucose: 100 mg/dL — ABNORMAL HIGH (ref 70–99)
Potassium: 4.9 mmol/L (ref 3.5–5.2)
Sodium: 141 mmol/L (ref 134–144)
Total Protein: 6.6 g/dL (ref 6.0–8.5)
eGFR: 76 mL/min/{1.73_m2} (ref >59)

## 2022-06-09 LAB — CBC WITH DIFFERENTIAL/PLATELET
Basophils Absolute: 0.1 10*3/uL (ref 0.0–0.2)
Basos: 1 %
EOS (ABSOLUTE): 0.2 10*3/uL (ref 0.0–0.4)
Eos: 2 %
Hematocrit: 40.6 % (ref 37.5–51.0)
Hemoglobin: 13.5 g/dL (ref 13.0–17.7)
Immature Grans (Abs): 0 10*3/uL (ref 0.0–0.1)
Immature Granulocytes: 0 %
Lymphocytes Absolute: 2.2 10*3/uL (ref 0.7–3.1)
Lymphs: 23 %
MCH: 30.1 pg (ref 26.6–33.0)
MCHC: 33.3 g/dL (ref 31.5–35.7)
MCV: 90 fL (ref 79–97)
Monocytes Absolute: 0.9 10*3/uL (ref 0.1–0.9)
Monocytes: 9 %
Neutrophils Absolute: 6.4 10*3/uL (ref 1.4–7.0)
Neutrophils: 65 %
Platelets: 275 10*3/uL (ref 150–450)
RBC: 4.49 x10E6/uL (ref 4.14–5.80)
RDW: 13.3 % (ref 11.6–15.4)
WBC: 9.8 10*3/uL (ref 3.4–10.8)

## 2022-06-09 LAB — PSA, TOTAL AND FREE
PSA, Free Pct: 32.4 %
PSA, Free: 0.81 ng/mL
Prostate Specific Ag, Serum: 2.5 ng/mL (ref 0.0–4.0)

## 2022-06-09 LAB — VITAMIN D 25 HYDROXY (VIT D DEFICIENCY, FRACTURES): Vit D, 25-Hydroxy: 34.8 ng/mL (ref 30.0–100.0)

## 2022-06-09 LAB — LIPID PANEL
Chol/HDL Ratio: 3.1 ratio (ref 0.0–5.0)
Cholesterol, Total: 181 mg/dL (ref 100–199)
HDL: 58 mg/dL (ref >39)
LDL Chol Calc (NIH): 97 mg/dL (ref 0–99)
Triglycerides: 153 mg/dL — ABNORMAL HIGH (ref 0–149)
VLDL Cholesterol Cal: 26 mg/dL (ref 5–40)

## 2022-06-09 NOTE — Progress Notes (Signed)
Hello Nicolas Ho,  Your lab result is normal and/or stable.Some minor variations that are not significant are commonly marked abnormal, but do not represent any medical problem for you.  Best regards, Saudia Smyser, M.D.

## 2022-06-14 ENCOUNTER — Ambulatory Visit (INDEPENDENT_AMBULATORY_CARE_PROVIDER_SITE_OTHER): Payer: BC Managed Care – PPO | Admitting: Family Medicine

## 2022-06-14 ENCOUNTER — Encounter: Payer: Self-pay | Admitting: Family Medicine

## 2022-06-14 VITALS — BP 123/71 | HR 77 | Temp 97.9°F | Ht 68.0 in | Wt 170.6 lb

## 2022-06-14 DIAGNOSIS — Z0001 Encounter for general adult medical examination with abnormal findings: Secondary | ICD-10-CM

## 2022-06-14 DIAGNOSIS — E7800 Pure hypercholesterolemia, unspecified: Secondary | ICD-10-CM

## 2022-06-14 DIAGNOSIS — E559 Vitamin D deficiency, unspecified: Secondary | ICD-10-CM | POA: Diagnosis not present

## 2022-06-14 DIAGNOSIS — Z0000 Encounter for general adult medical examination without abnormal findings: Secondary | ICD-10-CM

## 2022-06-14 MED ORDER — SIMVASTATIN 10 MG PO TABS: 10.0000 mg | ORAL_TABLET | Freq: Every day | ORAL | 3 refills | Status: DC

## 2022-06-14 NOTE — Progress Notes (Signed)
Subjective:  Patient ID: Nicolas Ho, male    DOB: [birth year]  Age: 61 y.o. MRN: [id number]  CC: Annual Exam   HPI MARSHEL [name] presents for Annual Exam.   in for follow-up of elevated cholesterol. Doing well without complaints on current medication. Denies side effects of statin including myalgia and arthralgia and nausea. Currently no chest pain, shortness of breath or other cardiovascular related symptoms noted. Taking multivitamin with some Vitamin D.   Had some nocturia, but started eating pumpkin seeds and has no further nocturia..  Retired from school system 2 months ago.      06/14/2022    8:54 AM 02/15/2021    9:04 AM 02/06/2020   10:59 AM  Depression screen PHQ 2/9  Decreased Interest 0 0 0  Down, Depressed, Hopeless 0 0 0  PHQ - 2 Score 0 0 0    History Klaus has a past medical history of Allergy, GERD (gastroesophageal reflux disease), Hyperlipidemia, and Kidney stones.   He has a past surgical history that includes Lithotripsy.   His family history includes Cancer in his brother and father; Diabetes in his mother; Heart disease in his brother; Hyperlipidemia in his sister.He reports that he quit smoking about 39 years ago. His smoking use included cigarettes. He has never used smokeless tobacco. He reports current alcohol use. He reports that he does not use drugs.    ROS Review of Systems  Constitutional:  Negative for activity change, fatigue and unexpected weight change.  HENT:  Negative for congestion, ear pain, hearing loss, postnasal drip and trouble swallowing.   Eyes:  Negative for pain and visual disturbance.  Respiratory:  Negative for cough, chest tightness and shortness of breath.   Cardiovascular:  Negative for chest pain, palpitations and leg swelling.  Gastrointestinal:  Negative for abdominal distention, abdominal pain, blood in stool, constipation, diarrhea, nausea and vomiting.  Endocrine: Negative for cold intolerance, heat intolerance  and polydipsia.  Genitourinary:  Negative for difficulty urinating, dysuria, flank pain, frequency and urgency.  Musculoskeletal:  Negative for arthralgias and joint swelling.  Skin:  Negative for color change, rash and wound.  Neurological:  Negative for dizziness, syncope, speech difficulty, weakness, light-headedness, numbness and headaches.  Hematological:  Does not bruise/bleed easily.  Psychiatric/Behavioral:  Negative for confusion, decreased concentration, dysphoric mood and sleep disturbance. The patient is not nervous/anxious.     Objective:  BP 123/71   Pulse 77   Temp 97.9 F (36.6 C)   Ht _0  (1.727 m)   Wt 170 lb 9.6 oz (77.4 kg)   SpO2 97%   BMI 25.94 kg/m   BP Readings from Last 3 Encounters:  06/14/22 123/71  09/27/21 137/81  02/15/21 124/71    Wt Readings from Last 3 Encounters:  06/14/22 170 lb 9.6 oz (77.4 kg)  09/27/21 160 lb (72.6 kg)  02/15/21 163 lb 9.6 oz (74.2 kg)     Physical Exam Constitutional:      Appearance: He is well-developed.  HENT:     Head: Normocephalic and atraumatic.  Eyes:     Pupils: Pupils are equal, round, and reactive to light.  Neck:     Thyroid: No thyromegaly.     Trachea: No tracheal deviation.  Cardiovascular:     Rate and Rhythm: Normal rate and regular rhythm.     Heart sounds: Normal heart sounds. No murmur heard.    No friction rub. No gallop.  Pulmonary:     Breath sounds: Normal breath  sounds. No wheezing or rales.  Abdominal:     General: Bowel sounds are normal. There is no distension.     Palpations: Abdomen is soft. There is no mass.     Tenderness: There is no abdominal tenderness.     Hernia: There is no hernia in the left inguinal area.  Genitourinary:    Penis: Normal.      Testes: Normal.  Musculoskeletal:        General: Normal range of motion.     Cervical back: Normal range of motion.  Lymphadenopathy:     Cervical: No cervical adenopathy.  Skin:    General: Skin is warm and dry.   Neurological:     Mental Status: He is alert and oriented to person, place, and time.    Results for orders placed or performed in visit on 06/08/22  VITAMIN D 25 Hydroxy (Vit-D Deficiency, Fractures)  Result Value Ref Range   Vit D, 25-Hydroxy 34.8 30.0 - 100.0 ng/mL  PSA, total and free  Result Value Ref Range   Prostate Specific Ag, Serum 2.5 0.0 - 4.0 ng/mL   PSA, Free 0.81 N/A ng/mL   PSA, Free Pct 32.4 %  Lipid panel  Result Value Ref Range   Cholesterol, Total 181 100 - 199 mg/dL   Triglycerides 153 (H) 0 - 149 mg/dL   HDL 58 >39 mg/dL   VLDL Cholesterol Cal 26 5 - 40 mg/dL   LDL Chol Calc (NIH) 97 0 - 99 mg/dL   Chol/HDL Ratio 3.1 0.0 - 5.0 ratio  CMP14+EGFR  Result Value Ref Range   Glucose 100 (H) 70 - 99 mg/dL   BUN 16 8 - 27 mg/dL   Creatinine, Ser 1.10 0.76 - 1.27 mg/dL   eGFR 76 >59 mL/min/1.73   BUN/Creatinine Ratio 15 10 - 24   Sodium 141 134 - 144 mmol/L   Potassium 4.9 3.5 - 5.2 mmol/L   Chloride 103 96 - 106 mmol/L   CO2 23 20 - 29 mmol/L   Calcium 9.6 8.6 - 10.2 mg/dL   Total Protein 6.6 6.0 - 8.5 g/dL   Albumin 4.7 3.9 - 4.9 g/dL   Globulin, Total 1.9 1.5 - 4.5 g/dL   Albumin/Globulin Ratio 2.5 (H) 1.2 - 2.2   Bilirubin Total 0.5 0.0 - 1.2 mg/dL   Alkaline Phosphatase 62 44 - 121 IU/L   AST 23 0 - 40 IU/L   ALT 29 0 - 44 IU/L  CBC with Differential/Platelet  Result Value Ref Range   WBC 9.8 3.4 - 10.8 x10E3/uL   RBC 4.49 4.14 - 5.80 x10E6/uL   Hemoglobin 13.5 13.0 - 17.7 g/dL   Hematocrit 40.6 37.5 - 51.0 %   MCV 90 79 - 97 fL   MCH 30.1 26.6 - 33.0 pg   MCHC 33.3 31.5 - 35.7 g/dL   RDW 13.3 11.6 - 15.4 %   Platelets 275 150 - 450 x10E3/uL   Neutrophils 65 Not Estab. %   Lymphs 23 Not Estab. %   Monocytes 9 Not Estab. %   Eos 2 Not Estab. %   Basos 1 Not Estab. %   Neutrophils Absolute 6.4 1.4 - 7.0 x10E3/uL   Lymphocytes Absolute 2.2 0.7 - 3.1 x10E3/uL   Monocytes Absolute 0.9 0.1 - 0.9 x10E3/uL   EOS (ABSOLUTE) 0.2 0.0 - 0.4  x10E3/uL   Basophils Absolute 0.1 0.0 - 0.2 x10E3/uL   Immature Granulocytes 0 Not Estab. %   Immature Grans (Abs) 0.0 0.0 -  0.1 x10E3/uL      Assessment & Plan:   Harvey was seen today for annual exam.  Diagnoses and all orders for this visit:  Well adult exam  Pure hypercholesterolemia  Vitamin D deficiency  Other orders -     simvastatin (ZOCOR) 10 MG tablet; Take 1 tablet (10 mg total) by mouth daily at 6 PM.       I have discontinued Daimien A. Radovich's cholecalciferol, amoxicillin-clavulanate, and predniSONE. I am also having him maintain his multivitamin and simvastatin.  Allergies as of 06/14/2022   No Known Allergies      Medication List        Accurate as of June 14, 2022  9:45 AM. If you have any questions, ask your nurse or doctor.          STOP taking these medications    amoxicillin-clavulanate 875-125 MG tablet Commonly known as: AUGMENTIN Stopped by: Claretta Fraise, MD   cholecalciferol 25 MCG (1000 UNIT) tablet Commonly known as: VITAMIN D3 Stopped by: Claretta Fraise, MD   predniSONE 10 MG (21) Tbpk tablet Commonly known as: STERAPRED UNI-PAK 21 TAB Stopped by: Claretta Fraise, MD       TAKE these medications    multivitamin tablet Take 1 tablet by mouth daily.   simvastatin 10 MG tablet Commonly known as: ZOCOR Take 1 tablet (10 mg total) by mouth daily at 6 PM.         Follow-up: Return in about 1 year (around 06/15/2023).  Claretta Fraise, M.D.

## 2022-07-01 ENCOUNTER — Ambulatory Visit: Payer: BC Managed Care – PPO | Admitting: Family

## 2022-07-01 ENCOUNTER — Encounter: Payer: Self-pay | Admitting: Family

## 2022-07-01 VITALS — BP 139/88 | HR 90 | Temp 98.5°F | Ht 68.0 in | Wt 170.6 lb

## 2022-07-01 DIAGNOSIS — L03116 Cellulitis of left lower limb: Secondary | ICD-10-CM | POA: Diagnosis not present

## 2022-07-01 DIAGNOSIS — T63301A Toxic effect of unspecified spider venom, accidental (unintentional), initial encounter: Secondary | ICD-10-CM | POA: Diagnosis not present

## 2022-07-01 MED ORDER — CEFTRIAXONE SODIUM 1 G IJ SOLR
1.0000 g | Freq: Once | INTRAMUSCULAR | Status: AC
  Administered 2022-07-01: 1 g via INTRAMUSCULAR

## 2022-07-01 MED ORDER — DOXYCYCLINE HYCLATE 100 MG PO TABS: 100.0000 mg | ORAL_TABLET | Freq: Two times a day (BID) | ORAL | 0 refills | Status: DC

## 2022-07-01 NOTE — Progress Notes (Signed)
   Subjective:    Patient ID: Nicolas Ho, male    DOB: [birth year], 61 y.o.   MRN: [id number]  Chief Complaint  Patient presents with   Insect Bite    Left leg     HPI PT presents to the office today with wound on left posterior leg. States he noticed Monday a sting. Then every day has become increased redness, tenderness, and warm. Denies any fever or discharge.    Review of Systems     Objective:   Physical Exam Vitals reviewed.  Constitutional:      General: He is not in acute distress.    Appearance: He is well-developed.  HENT:     Head: Normocephalic.     Right Ear: External ear normal.     Left Ear: External ear normal.  Eyes:     General:        Right eye: No discharge.        Left eye: No discharge.     Pupils: Pupils are equal, round, and reactive to light.  Neck:     Thyroid: No thyromegaly.  Cardiovascular:     Rate and Rhythm: Normal rate and regular rhythm.     Heart sounds: Normal heart sounds. No murmur heard. Pulmonary:     Effort: Pulmonary effort is normal. No respiratory distress.     Breath sounds: Normal breath sounds. No wheezing.  Abdominal:     General: Bowel sounds are normal. There is no distension.     Palpations: Abdomen is soft.     Tenderness: There is no abdominal tenderness.  Musculoskeletal:        General: No tenderness. Normal range of motion.     Cervical back: Normal range of motion and neck supple.  Skin:    General: Skin is warm and dry.     Findings: No erythema or rash.          Comments: Erythemas wound approx 11X8 cm, tender, mild swelling. No discharge  Neurological:     Mental Status: He is alert and oriented to person, place, and time.     Cranial Nerves: No cranial nerve deficit.     Deep Tendon Reflexes: Reflexes are normal and symmetric.  Psychiatric:        Behavior: Behavior normal.        Thought Content: Thought content normal.        Judgment: Judgment normal.            Assessment & Plan:    Nicolas Ho comes in today with chief complaint of Insect Bite (Left leg )   Diagnosis and orders addressed:  1. Spider bite wound, accidental or unintentional, initial encounter Start doxycyline Area marked- if erythemas, discharge, fever, or pain worsen go to ED. May need IV antibiotics.  Follow up Tuesday with PCP. ( We are closed Monday) - doxycycline (VIBRA-TABS) 100 MG tablet; Take 1 tablet (100 mg total) by mouth 2 (two) times daily.  Dispense: 20 tablet; Refill: 0 - cefTRIAXone (ROCEPHIN) injection 1 g  2. Cellulitis of left lower extremity Red flags discussed    Jannifer Rodney, FNP

## 2022-07-01 NOTE — Patient Instructions (Signed)
Spider Bite Spider bites are not common. When spider bites do happen, most do not cause serious health problems. There are only a few types of spider bites that can cause serious health problems. What are the causes? A spider bite is often caused by a person accidentally making contact with a spider in a way that traps the spider against the skin. What increases the risk? You are more likely to be bitten by a spider if: You live in an area where spiders live, and you disturb their habitat. You work outdoors, such as a landscaper or farmer. You participate in certain outdoor activities, such as playing in leaves or hiking. What are the signs or symptoms? Symptoms may vary depending on the type of spider. Some spider bites may cause symptoms within 1 hour after the bite. For other spider bites, it may take 1-2 days for symptoms to develop. Common symptoms of this condition include: A raised area that is red. Redness and swelling around the area of the bite or bites. Discomfort or pain in the area of the bite. A few types of spiders, such as the black widow or the brown recluse, can inject poison (venom) into a bite wound. This venom causes more serious symptoms. Symptoms of a venomous spider bite vary and may include: Muscle cramps. Nausea, vomiting, or abdominal pain. A fever. A skin sore (lesion) that spreads. This can break into an open wound (skin ulcer). Light-headedness or dizziness. How is this diagnosed? This condition may be diagnosed based on your symptoms and a physical exam. Your health care provider will ask about the history of your injury and any details you may have about the spider. This may help to determine what type of spider bit you. How is this treated? Many spider bites do not require treatment. If needed, this condition may be treated by: Icing and keeping the bite area raised (elevated). Taking or applying over-the-counter or prescription medicines to help control  symptoms such as pain and itching. Having a tetanus shot, if needed. Taking antibiotic medicine. Follow these instructions at home: Medicines Take or apply over-the-counter and prescription medicines only as told by your health care provider. If you were prescribed an antibiotic medicine, take or apply it as told by your health care provider. Do not stop using the antibiotic even if you start to feel better or if your condition improves. Managing pain and swelling  If directed, put ice on the bite area. To do this: Put ice in a plastic bag. Place a towel between your skin and the bag. Leave the ice on for 20 minutes, 2-3 times a day. Remove the ice if your skin turns bright red. This is very important. If you cannot feel pain, heat, or cold, you have a greater risk of damage to the area. Elevate the bite area above the level of your heart while you are sitting or lying down. General instructions  Do not scratch the bite area. Keep the bite area clean and dry. Wash the bite area daily with soap and water as told by your health care provider. Keep all follow-up visits. This is important. Contact a health care provider if: Your bite does not get better after 3 days of treatment. Your bite turns black or purple. You have increased redness, swelling, or pain at the site of the bite. Get help right away if: You develop shortness of breath or chest pain. You have fluid, blood, or pus coming from the bite area. You have   muscle cramps or painful muscle spasms. You develop abdominal pain, nausea, or vomiting. You feel unusually tired (fatigued) or sleepy. These symptoms may represent a serious problem that is an emergency. Do not wait to see if the symptoms will go away. Get medical help right away. Call your local emergency services (911 in the [place] Do not drive yourself to the hospital. Summary Spider bites are not common. When spider bites do happen, most do not cause serious health  problems. Take or apply over-the-counter and prescription medicines only as told by your health care provider. Keep the bite area clean and dry. Wash the bite area daily with soap and water as told by your health care provider. Contact a health care provider if you have increased redness, swelling, or pain at the site of the bite. Get help right away if you develop shortness of breath or chest pain. This information is not intended to replace advice given to you by your health care provider. Make sure you discuss any questions you have with your health care provider. Document Revised: 08/05/2020 Document Reviewed: 08/05/2020 Elsevier Patient Education  2023 [employer]  

## 2022-07-05 ENCOUNTER — Encounter: Payer: Self-pay | Admitting: Family Medicine

## 2022-07-05 ENCOUNTER — Ambulatory Visit (INDEPENDENT_AMBULATORY_CARE_PROVIDER_SITE_OTHER): Payer: BC Managed Care – PPO | Admitting: Family Medicine

## 2022-07-05 VITALS — BP 135/80 | HR 100 | Temp 98.7°F | Ht 68.0 in | Wt 170.0 lb

## 2022-07-05 DIAGNOSIS — T63301A Toxic effect of unspecified spider venom, accidental (unintentional), initial encounter: Secondary | ICD-10-CM | POA: Diagnosis not present

## 2022-07-05 NOTE — Progress Notes (Signed)
Chief Complaint  Patient presents with   Follow-up   Insect Bite    HPI  Patient presents today for wound to left popliteal fossa. Likely spider bite. Pt. Repoted a little twinge occurring 8 days ago. Progressively ore red, swollen and painful for 4 days. Then seen and started on doxy. Now feeling much better although the doxycycline made him nauseous the first day.   PMH: Smoking status noted ROS: Per HPI  Objective: BP 135/80   Pulse 100   Temp 98.7 F (37.1 C)   Ht 5\' 8"  (1.727 m)   Wt 170 lb (77.1 kg)   SpO2 99%   BMI 25.85 kg/m  Gen: NAD, alert, cooperative with exam Ext: No edema, warm. 1.5 X 3.4 CM crust in the center of hyperemic, clean tissue at the popliteal left knee. No edema or erythema. Minimally tender. No exudate.  Neuro: Alert and oriented, No gross deficits  Assessment and plan:  1. Spider bite wound, accidental or unintentional, initial encounter     Signs & sx of infection reviewed. Finish the antibiotic. Keep clean and dry.  Follow up as needed should infection markers occur. , MD

## 2023-06-16 ENCOUNTER — Other Ambulatory Visit: Payer: BC Managed Care – PPO

## 2023-06-16 ENCOUNTER — Other Ambulatory Visit: Payer: Self-pay

## 2023-06-16 DIAGNOSIS — Z0000 Encounter for general adult medical examination without abnormal findings: Secondary | ICD-10-CM

## 2023-06-17 LAB — CMP14+EGFR
ALT: 21 IU/L (ref 0–44)
AST: 23 IU/L (ref 0–40)
Albumin: 4.5 g/dL (ref 3.9–4.9)
Alkaline Phosphatase: 55 IU/L (ref 44–121)
BUN/Creatinine Ratio: 9 — ABNORMAL LOW (ref 10–24)
BUN: 10 mg/dL (ref 8–27)
Bilirubin Total: 0.4 mg/dL (ref 0.0–1.2)
CO2: 21 mmol/L (ref 20–29)
Calcium: 9.6 mg/dL (ref 8.6–10.2)
Chloride: 103 mmol/L (ref 96–106)
Creatinine, Ser: 1.14 mg/dL (ref 0.76–1.27)
Globulin, Total: 2.2 g/dL (ref 1.5–4.5)
Glucose: 100 mg/dL — ABNORMAL HIGH (ref 70–99)
Potassium: 4.7 mmol/L (ref 3.5–5.2)
Sodium: 140 mmol/L (ref 134–144)
Total Protein: 6.7 g/dL (ref 6.0–8.5)
eGFR: 72 mL/min/{1.73_m2} (ref >59)

## 2023-06-17 LAB — PSA, TOTAL AND FREE
PSA, Free Pct: 39.2 %
PSA, Free: 0.47 ng/mL
Prostate Specific Ag, Serum: 1.2 ng/mL (ref 0.0–4.0)

## 2023-06-17 LAB — CBC WITH DIFFERENTIAL/PLATELET
Basophils Absolute: 0.1 10*3/uL (ref 0.0–0.2)
Basos: 1 %
EOS (ABSOLUTE): 0.1 10*3/uL (ref 0.0–0.4)
Eos: 2 %
Hematocrit: 40.1 % (ref 37.5–51.0)
Hemoglobin: 13.6 g/dL (ref 13.0–17.7)
Immature Grans (Abs): 0 10*3/uL (ref 0.0–0.1)
Immature Granulocytes: 0 %
Lymphocytes Absolute: 1.7 10*3/uL (ref 0.7–3.1)
Lymphs: 25 %
MCH: 30.6 pg (ref 26.6–33.0)
MCHC: 33.9 g/dL (ref 31.5–35.7)
MCV: 90 fL (ref 79–97)
Monocytes Absolute: 0.6 10*3/uL (ref 0.1–0.9)
Monocytes: 8 %
Neutrophils Absolute: 4.4 10*3/uL (ref 1.4–7.0)
Neutrophils: 64 %
Platelets: 275 10*3/uL (ref 150–450)
RBC: 4.45 x10E6/uL (ref 4.14–5.80)
RDW: 13.1 % (ref 11.6–15.4)
WBC: 6.9 10*3/uL (ref 3.4–10.8)

## 2023-06-17 LAB — LIPID PANEL
Chol/HDL Ratio: 3.2 ratio (ref 0.0–5.0)
Cholesterol, Total: 161 mg/dL (ref 100–199)
HDL: 50 mg/dL (ref >39)
LDL Chol Calc (NIH): 87 mg/dL (ref 0–99)
Triglycerides: 136 mg/dL (ref 0–149)
VLDL Cholesterol Cal: 24 mg/dL (ref 5–40)

## 2023-06-17 NOTE — Progress Notes (Signed)
Hello Nicolas Ho,  Your lab result is normal and/or stable.Some minor variations that are not significant are commonly marked abnormal, but do not represent any medical problem for you.  Best regards, Warren Stacks, M.D.

## 2023-06-19 ENCOUNTER — Encounter: Payer: BC Managed Care – PPO | Admitting: Family Medicine

## 2023-06-22 ENCOUNTER — Encounter: Payer: Self-pay | Admitting: Family Medicine

## 2023-06-22 ENCOUNTER — Ambulatory Visit (INDEPENDENT_AMBULATORY_CARE_PROVIDER_SITE_OTHER): Payer: BC Managed Care – PPO

## 2023-06-22 ENCOUNTER — Ambulatory Visit (INDEPENDENT_AMBULATORY_CARE_PROVIDER_SITE_OTHER): Payer: BC Managed Care – PPO | Admitting: Family Medicine

## 2023-06-22 VITALS — BP 133/81 | HR 85 | Temp 98.3°F | Resp 20 | Ht 68.0 in | Wt 171.5 lb

## 2023-06-22 DIAGNOSIS — Z125 Encounter for screening for malignant neoplasm of prostate: Secondary | ICD-10-CM

## 2023-06-22 DIAGNOSIS — Z23 Encounter for immunization: Secondary | ICD-10-CM | POA: Diagnosis not present

## 2023-06-22 DIAGNOSIS — M25541 Pain in joints of right hand: Secondary | ICD-10-CM

## 2023-06-22 DIAGNOSIS — Z87442 Personal history of urinary calculi: Secondary | ICD-10-CM | POA: Diagnosis not present

## 2023-06-22 DIAGNOSIS — J302 Other seasonal allergic rhinitis: Secondary | ICD-10-CM

## 2023-06-22 DIAGNOSIS — Z0001 Encounter for general adult medical examination with abnormal findings: Secondary | ICD-10-CM

## 2023-06-22 DIAGNOSIS — E7800 Pure hypercholesterolemia, unspecified: Secondary | ICD-10-CM | POA: Diagnosis not present

## 2023-06-22 DIAGNOSIS — E559 Vitamin D deficiency, unspecified: Secondary | ICD-10-CM | POA: Diagnosis not present

## 2023-06-22 DIAGNOSIS — Z0000 Encounter for general adult medical examination without abnormal findings: Secondary | ICD-10-CM

## 2023-06-22 LAB — URINALYSIS
Bilirubin, UA: NEGATIVE
Glucose, UA: NEGATIVE
Ketones, UA: NEGATIVE
Leukocytes,UA: NEGATIVE
Nitrite, UA: NEGATIVE
Protein,UA: NEGATIVE
Specific Gravity, UA: 1.01 (ref 1.005–1.030)
Urobilinogen, Ur: 0.2 mg/dL (ref 0.2–1.0)
pH, UA: 5.5 (ref 5.0–7.5)

## 2023-06-22 LAB — URINALYSIS, MICROSCOPIC ONLY
Bacteria, UA: NONE SEEN
Epithelial Cells (non renal): NONE SEEN /hpf (ref 0–10)
Renal Epithel, UA: NONE SEEN /hpf

## 2023-06-22 NOTE — Progress Notes (Signed)
Subjective:  Patient ID: Nicolas Ho, male    DOB: 08/19/1961  Age: 62 y.o. MRN: [id number]  CC: Annual Exam   HPI Nicolas Ho presents for CPE   in for follow-up of elevated cholesterol. Doing well without complaints on current medication. Denies side effects of statin including myalgia and arthralgia and nausea. Currently no chest pain, shortness of breath or other cardiovascular related symptoms noted.  .Taking med for allergies OTC. Not currently taking Vit. D     07/05/2022    1:13 PM 06/14/2022    8:54 AM 02/15/2021    9:04 AM  Depression screen PHQ 2/9  Decreased Interest 0 0 0  Down, Depressed, Hopeless 0 0 0  PHQ - 2 Score 0 0 0    History Nicolas Ho has a past medical history of Allergy, GERD (gastroesophageal reflux disease), Hyperlipidemia, and Kidney stones.   Nicolas Ho has a past surgical history that includes Lithotripsy.   His family history includes Cancer in his brother and father; Diabetes in his mother; Heart disease in his brother; Hyperlipidemia in his sister.Nicolas Ho reports that Nicolas Ho quit smoking about 40 years ago. His smoking use included cigarettes. Nicolas Ho has never used smokeless tobacco. Nicolas Ho reports current alcohol use. Nicolas Ho reports that Nicolas Ho does not use drugs.    ROS Review of Systems  Constitutional:  Negative for activity change, fatigue and unexpected weight change.  HENT:  Negative for congestion, ear pain, hearing loss, postnasal drip and trouble swallowing.   Eyes:  Negative for pain and visual disturbance.  Respiratory:  Negative for cough, chest tightness and shortness of breath.   Cardiovascular:  Negative for chest pain, palpitations and leg swelling.  Gastrointestinal:  Negative for abdominal distention, abdominal pain, blood in stool, constipation, diarrhea, nausea and vomiting.  Endocrine: Negative for cold intolerance, heat intolerance and polydipsia.  Genitourinary:  Negative for difficulty urinating, dysuria, flank pain, frequency and urgency.   Musculoskeletal:  Positive for arthralgias (pain in shoulders, 3&5th fingers of right hand). Negative for joint swelling.  Skin:  Negative for color change, rash and wound.  Neurological:  Negative for dizziness, syncope, speech difficulty, weakness, light-headedness, numbness and headaches.  Hematological:  Does not bruise/bleed easily.  Psychiatric/Behavioral:  Negative for confusion, decreased concentration, dysphoric mood and sleep disturbance. The patient is not nervous/anxious.     Objective:  BP 133/81   Pulse 85   Temp 98.3 F (36.8 C) (Oral)   Resp 20   Ht 5\' 8"  (1.727 m)   Wt 171 lb 8 oz (77.8 kg)   SpO2 97%   BMI 26.08 kg/m   BP Readings from Last 3 Encounters:  06/22/23 133/81  07/05/22 135/80  07/01/22 139/88    Wt Readings from Last 3 Encounters:  06/22/23 171 lb 8 oz (77.8 kg)  07/05/22 170 lb (77.1 kg)  07/01/22 170 lb 9.6 oz (77.4 kg)     Physical Exam Constitutional:      Appearance: Nicolas Ho is well-developed.  HENT:     Head: Normocephalic and atraumatic.  Eyes:     Pupils: Pupils are equal, round, and reactive to light.  Neck:     Thyroid: No thyromegaly.     Trachea: No tracheal deviation.  Cardiovascular:     Rate and Rhythm: Normal rate and regular rhythm.     Heart sounds: Normal heart sounds. No murmur heard.    No friction rub. No gallop.  Pulmonary:     Breath sounds: Normal breath sounds. No wheezing or rales.  Abdominal:     General: Bowel sounds are normal. There is no distension.     Palpations: Abdomen is soft. There is no mass.     Tenderness: There is no abdominal tenderness.     Hernia: There is no hernia in the left inguinal area.  Genitourinary:    Penis: Normal.      Testes: Normal.  Musculoskeletal:        General: Deformity: Can't straighten R 5th PIP. Normal range of motion.     Cervical back: Normal range of motion.  Lymphadenopathy:     Cervical: No cervical adenopathy.  Skin:    General: Skin is warm and dry.   Neurological:     Mental Status: Nicolas Ho is alert and oriented to person, place, and time.       Assessment & Plan:   Nicolas Ho was seen today for annual exam.  Diagnoses and all orders for this visit:  Annual physical exam -     CBC with Differential/Platelet -     CMP14+EGFR -     Urinalysis  History of kidney stones -     CBC with Differential/Platelet -     CMP14+EGFR -     Urinalysis -     Urine Microscopic  Pure hypercholesterolemia -     CBC with Differential/Platelet -     CMP14+EGFR -     Lipid panel  Vitamin D deficiency -     CBC with Differential/Platelet -     CMP14+EGFR  Seasonal allergic rhinitis, unspecified trigger -     CBC with Differential/Platelet -     CMP14+EGFR  Screening for prostate cancer -     CBC with Differential/Platelet -     CMP14+EGFR -     PSA, total and free  Pain, joint, hand, right -     DG Hand Complete Right; Future  Other orders -     Tdap vaccine greater than or equal to 7yo IM       I have discontinued Nicolas Ho's doxycycline. I am also having him maintain his multivitamin, simvastatin, and (Ginger, Zingiber officinalis, (GINGER PO)).  Allergies as of 06/22/2023   No Known Allergies      Medication List        Accurate as of June 22, 2023 11:59 PM. If you have any questions, ask your nurse or doctor.          STOP taking these medications    doxycycline 100 MG tablet Commonly known as: VIBRA-TABS Stopped by: [name]       TAKE these medications    GINGER PO Take by mouth.   multivitamin tablet Take 1 tablet by mouth daily.   simvastatin 10 MG tablet Commonly known as: ZOCOR Take 1 tablet (10 mg total) by mouth daily at 6 PM.         Follow-up: Return in about 6 months (around 12/23/2023).  Mechele Claude, M.D.

## 2023-08-10 ENCOUNTER — Other Ambulatory Visit: Payer: Self-pay | Admitting: Family Medicine

## 2023-08-10 ENCOUNTER — Telehealth: Payer: Self-pay | Admitting: Family Medicine

## 2023-08-10 MED ORDER — ROSUVASTATIN CALCIUM 10 MG PO TABS: 10.0000 mg | ORAL_TABLET | Freq: Every day | ORAL | 1 refills | Status: DC

## 2023-08-10 NOTE — Telephone Encounter (Signed)
Scrip sent. Great Choice!

## 2023-09-21 ENCOUNTER — Telehealth: Payer: Self-pay | Admitting: Family Medicine

## 2023-09-21 NOTE — Telephone Encounter (Signed)
Message for billing.  Copied from CRM [id number]. Topic: General - Billing Inquiry >> Sep 21, 2023  8:20 AM Almira Coaster wrote: Reason for CRM: Patient received a bill for service date 06/22/2023 when patient had his annual physical and lab work, Patient called his insurance that stated his labs were coded incorrectly. Z00.00 needs to be removed and it needs to be resubmitted to the insurance. Patient would like a call back once the process has started or if you need a copy of the bill. Call back number [phone].

## 2023-11-14 ENCOUNTER — Encounter: Payer: Self-pay | Admitting: Family Medicine

## 2023-11-14 ENCOUNTER — Ambulatory Visit (HOSPITAL_COMMUNITY)
Admission: RE | Admit: 2023-11-14 | Discharge: 2023-11-14 | Disposition: A | Discharge status: Home / Self Care | Payer: 59 | Source: Ambulatory Visit | Attending: Family Medicine | Admitting: Family Medicine

## 2023-11-14 ENCOUNTER — Ambulatory Visit: Payer: Self-pay | Admitting: Family Medicine

## 2023-11-14 VITALS — BP 151/80 | HR 75 | Temp 96.1°F | Ht 68.0 in | Wt 169.0 lb

## 2023-11-14 DIAGNOSIS — K6289 Other specified diseases of anus and rectum: Secondary | ICD-10-CM

## 2023-11-14 DIAGNOSIS — R3129 Other microscopic hematuria: Secondary | ICD-10-CM | POA: Diagnosis not present

## 2023-11-14 DIAGNOSIS — R300 Dysuria: Secondary | ICD-10-CM

## 2023-11-14 DIAGNOSIS — N50812 Left testicular pain: Secondary | ICD-10-CM | POA: Diagnosis not present

## 2023-11-14 LAB — MICROSCOPIC EXAMINATION
Bacteria, UA: NONE SEEN
Renal Epithel, UA: NONE SEEN /[HPF]
Yeast, UA: NONE SEEN

## 2023-11-14 LAB — URINALYSIS, ROUTINE W REFLEX MICROSCOPIC
Bilirubin, UA: NEGATIVE
Glucose, UA: NEGATIVE
Ketones, UA: NEGATIVE
Leukocytes,UA: NEGATIVE
Nitrite, UA: NEGATIVE
Protein,UA: NEGATIVE
Specific Gravity, UA: 1.02 (ref 1.005–1.030)
Urobilinogen, Ur: 0.2 mg/dL (ref 0.2–1.0)
pH, UA: 5.5 (ref 5.0–7.5)

## 2023-11-14 NOTE — Progress Notes (Signed)
 Subjective:  Patient ID: Nicolas Ho, male    DOB: 04-Mar-1961, 63 y.o.   MRN: [id number]  Patient Care Team: Zollie Lowers, MD as PCP - General (Family Medicine)   Chief Complaint:  Dysuria and Abdominal Pain (X 2-3 weeks )   HPI: Nicolas Ho is a 63 y.o. male presenting on 11/14/2023 for Dysuria and Abdominal Pain (X 2-3 weeks )   Discussed the use of AI scribe software for clinical note transcription with the patient, who gave verbal consent to proceed.  History of Present Illness   Nicolas Ho, a patient with a history of kidney stones, presents with a three-week history of discomfort in the left flank and testicular area. The discomfort is described as a burning sensation, which has been progressively moving from the testicular area to the left flank. The patient also reports an increase in urinary frequency, having to urinate twice in one night, and a change in urine color from a deep yellow to a lighter shade.  To manage these symptoms, the patient has been taking Chancapedra, a supplement known as 'stone crusher' on the internet, and vinegar. The patient also reports a burning sensation in the rectum, which is not associated with bowel movements or blood in the stool.  In the past, the patient has had multiple kidney stones and has noticed occasional blood in the urine. A previous urologist consultation revealed no significant findings, attributing the blood in the urine to a natural occurrence in some individuals. The patient has not noticed any lumps or discoloration in the testicular area.  The patient's symptoms vary, with the discomfort initially starting in the testicles and then moving to the left flank. The patient expresses concern about the new symptom of testicular discomfort, which has not been previously experienced with kidney stones. The patient is open to further diagnostic testing to determine the cause of these symptoms.          Relevant past medical,  surgical, family, and social history reviewed and updated as indicated.  Allergies and medications reviewed and updated. Data reviewed: Chart in Epic.   Past Medical History:  Diagnosis Date   Allergy    GERD (gastroesophageal reflux disease)    Hyperlipidemia    Kidney stones     Past Surgical History:  Procedure Laterality Date   LITHOTRIPSY      Social History   Socioeconomic History   Marital status: Married    Spouse name: Not on file   Number of children: Not on file   Years of education: Not on file   Highest education level: Not on file  Occupational History   Occupation: custodian    Associate Professor: [employer]    Comment: [employer]  Tobacco Use   Smoking status: Former    Current packs/day: 0.00    Types: Cigarettes    Quit date: 05/11/1983    Years since quitting: 40.5   Smokeless tobacco: Never  Vaping Use   Vaping status: Never Used  Substance and Sexual Activity   Alcohol use: Yes   Drug use: No   Sexual activity: Yes  Other Topics Concern   Not on file  Social History Narrative   Married   2 children   1 dog         Social Drivers of Corporate Investment Banker Strain: Not on file  Food Insecurity: Not on file  Transportation Needs: Not on file  Physical Activity: Not on file  Stress: Not on file  Social Connections: Not on file  Intimate Partner Violence: Not on file    Outpatient Encounter Medications as of 11/14/2023  Medication Sig   Ginger, Zingiber officinalis, (GINGER PO) Take by mouth.   Multiple Vitamin (MULTIVITAMIN) tablet Take 1 tablet by mouth daily.   rosuvastatin  (CRESTOR ) 10 MG tablet Take 1 tablet (10 mg total) by mouth daily. For cholesterol   No facility-administered encounter medications on file as of 11/14/2023.    No Known Allergies  Pertinent ROS per HPI, otherwise unremarkable      Objective:  BP (!) 151/80   Pulse 75   Temp (!) 96.1 F (35.6 C)   Ht 5' 8 (1.727 m)   Wt 169 lb (76.7 kg)    SpO2 98%   BMI 25.70 kg/m    Wt Readings from Last 3 Encounters:  11/14/23 169 lb (76.7 kg)  06/22/23 171 lb 8 oz (77.8 kg)  07/05/22 170 lb (77.1 kg)    Physical Exam Vitals and nursing note reviewed. Exam conducted with a chaperone present.  Constitutional:      General: He is not in acute distress.    Appearance: Normal appearance. He is not ill-appearing, toxic-appearing or diaphoretic.  HENT:     Head: Normocephalic and atraumatic.     Nose: Nose normal.     Mouth/Throat:     Mouth: Mucous membranes are moist.     Pharynx: Oropharynx is clear.  Eyes:     Conjunctiva/sclera: Conjunctivae normal.     Pupils: Pupils are equal, round, and reactive to light.  Cardiovascular:     Rate and Rhythm: Normal rate and regular rhythm.     Heart sounds: Normal heart sounds.  Pulmonary:     Effort: Pulmonary effort is normal.     Breath sounds: Normal breath sounds.  Abdominal:     Hernia: There is no hernia in the left inguinal area or right inguinal area.  Genitourinary:    Penis: Normal and circumcised. No phimosis, paraphimosis, hypospadias, erythema, tenderness, discharge, swelling or lesions.      Testes: Normal. Cremasteric reflex is present.     Epididymis:     Right: Normal.     Left: Normal.     Tanner stage (genital): 5.  Musculoskeletal:     Cervical back: Neck supple.     Right lower leg: No edema.     Left lower leg: No edema.  Lymphadenopathy:     Lower Body: No right inguinal adenopathy. No left inguinal adenopathy.  Skin:    General: Skin is warm and dry.     Capillary Refill: Capillary refill takes less than 2 seconds.  Neurological:     General: No focal deficit present.     Mental Status: He is alert and oriented to person, place, and time.  Psychiatric:        Mood and Affect: Mood normal.        Behavior: Behavior normal.        Thought Content: Thought content normal.        Judgment: Judgment normal.       Results for orders placed or  performed in visit on 06/22/23  Urinalysis   Collection Time: 06/22/23 11:56 AM  Result Value Ref Range   Specific Gravity, UA 1.010 1.005 - 1.030   pH, UA 5.5 5.0 - 7.5   Color, UA Yellow Yellow   Appearance Ur Clear Clear   Leukocytes,UA Negative Negative   Protein,UA Negative  Negative/Trace   Glucose, UA Negative Negative   Ketones, UA Negative Negative   RBC, UA 2+ (A) Negative   Bilirubin, UA Negative Negative   Urobilinogen, Ur 0.2 0.2 - 1.0 mg/dL   Nitrite, UA Negative Negative  Urine Microscopic   Collection Time: 06/22/23  1:45 PM  Result Value Ref Range   WBC, UA 0-5 0 - 5 /hpf   RBC, Urine 0-2 0 - 2 /hpf   Epithelial Cells (non renal) None seen 0 - 10 /hpf   Renal Epithel, UA None seen None seen /hpf   Bacteria, UA None seen None seen/Few       Pertinent labs & imaging results that were available during my care of the patient were reviewed by me and considered in my medical decision making.  Assessment & Plan:  Nicolas Ho was seen today for dysuria and abdominal pain.  Diagnoses and all orders for this visit:  Dysuria -     Urine Culture -     Urinalysis, Routine w reflex microscopic -     US  SCROTUM W/DOPPLER; Future -     PSA, total and free -     Ambulatory referral to Urology -     CBC with Differential/Platelet  Pain in left testicle -     Urine Culture -     Urinalysis, Routine w reflex microscopic -     US  SCROTUM W/DOPPLER; Future -     PSA, total and free -     Ambulatory referral to Urology -     CBC with Differential/Platelet  Rectal pain -     Urine Culture -     Urinalysis, Routine w reflex microscopic -     US  SCROTUM W/DOPPLER; Future -     PSA, total and free -     CBC with Differential/Platelet  Other microscopic hematuria -     Urine Culture -     Urinalysis, Routine w reflex microscopic -     US  SCROTUM W/DOPPLER; Future -     PSA, total and free -     Ambulatory referral to Urology -     CBC with Differential/Platelet      Assessment and Plan    Hematuria Intermittent hematuria with a history of kidney stones. Urinalysis confirmed blood without infection. Differential includes recurrent kidney stones, UTI, or prostate issues. Last PSA test four months ago was normal. Discussed PSA test accuracy and prostate health concerns. - Order testicular ultrasound - Order PSA blood test - Refer to urology for further evaluation  Testicular Pain Burning sensation and numbness in the testicular area without lumps or discoloration. Differential includes referred pain from kidney stones, testicular torsion, or other pathology. Need for testicular ultrasound discussed. - Order testicular ultrasound - Refer to urology for further evaluation  Kidney Stones Recurrent kidney stones with burning sensation in urine and occasional flank pain. No signs of infection or acute distress. Uses Chancapedra and vinegar for symptom management. Further evaluation by urology discussed. - Refer to urology for further evaluation  General Health Maintenance Concerned about prostate health. Last PSA test four months ago was normal. Importance of regular screenings and monitoring PSA levels discussed. - Order PSA blood test - Refer to urology for further evaluation  Follow-up - Referral coordinator to call to set up urology appointment - Follow up if symptoms worsen or new symptoms develop.          Continue all other maintenance medications.  Follow up plan: Return if  symptoms worsen or fail to improve.   Continue healthy lifestyle choices, including diet (rich in fruits, vegetables, and lean proteins, and low in salt and simple carbohydrates) and exercise (at least 30 minutes of moderate physical activity daily).   The above assessment and management plan was discussed with the patient. The patient verbalized understanding of and has agreed to the management plan. Patient is aware to call the clinic if they develop any new  symptoms or if symptoms persist or worsen. Patient is aware when to return to the clinic for a follow-up visit. Patient educated on when it is appropriate to go to the emergency department.   Rosaline Bruns, FNP-C [facility] [phone]

## 2023-11-15 LAB — CBC WITH DIFFERENTIAL/PLATELET
Basophils Absolute: 0.1 10*3/uL (ref 0.0–0.2)
Basos: 1 %
EOS (ABSOLUTE): 0.1 10*3/uL (ref 0.0–0.4)
Eos: 1 %
Hematocrit: 43.7 % (ref 37.5–51.0)
Hemoglobin: 14.6 g/dL (ref 13.0–17.7)
Immature Grans (Abs): 0 10*3/uL (ref 0.0–0.1)
Immature Granulocytes: 0 %
Lymphocytes Absolute: 1.7 10*3/uL (ref 0.7–3.1)
Lymphs: 23 %
MCH: 30.4 pg (ref 26.6–33.0)
MCHC: 33.4 g/dL (ref 31.5–35.7)
MCV: 91 fL (ref 79–97)
Monocytes Absolute: 0.6 10*3/uL (ref 0.1–0.9)
Monocytes: 8 %
Neutrophils Absolute: 5.2 10*3/uL (ref 1.4–7.0)
Neutrophils: 67 %
Platelets: 276 10*3/uL (ref 150–450)
RBC: 4.81 x10E6/uL (ref 4.14–5.80)
RDW: 12 % (ref 11.6–15.4)
WBC: 7.7 10*3/uL (ref 3.4–10.8)

## 2023-11-15 LAB — PSA, TOTAL AND FREE
PSA, Free Pct: 35.4 %
PSA, Free: 0.46 ng/mL
Prostate Specific Ag, Serum: 1.3 ng/mL (ref 0.0–4.0)

## 2023-11-15 LAB — URINE CULTURE: Organism ID, Bacteria: NO GROWTH

## 2023-12-05 ENCOUNTER — Encounter: Payer: Self-pay | Admitting: Urology

## 2023-12-05 ENCOUNTER — Ambulatory Visit: Payer: 59 | Admitting: Urology

## 2023-12-05 VITALS — BP 166/88 | HR 82

## 2023-12-05 DIAGNOSIS — R300 Dysuria: Secondary | ICD-10-CM

## 2023-12-05 DIAGNOSIS — R3129 Other microscopic hematuria: Secondary | ICD-10-CM

## 2023-12-05 DIAGNOSIS — Z87442 Personal history of urinary calculi: Secondary | ICD-10-CM

## 2023-12-05 LAB — MICROSCOPIC EXAMINATION
Bacteria, UA: NONE SEEN
WBC, UA: NONE SEEN /[HPF] (ref 0–5)

## 2023-12-05 LAB — URINALYSIS, ROUTINE W REFLEX MICROSCOPIC
Bilirubin, UA: NEGATIVE
Glucose, UA: NEGATIVE
Ketones, UA: NEGATIVE
Leukocytes,UA: NEGATIVE
Nitrite, UA: NEGATIVE
Protein,UA: NEGATIVE
Specific Gravity, UA: 1.025 (ref 1.005–1.030)
Urobilinogen, Ur: 0.2 mg/dL (ref 0.2–1.0)
pH, UA: 6 (ref 5.0–7.5)

## 2023-12-05 MED ORDER — TAMSULOSIN HCL 0.4 MG PO CAPS: 0.4000 mg | ORAL_CAPSULE | Freq: Every day | ORAL | 0 refills | Status: DC

## 2023-12-05 NOTE — Progress Notes (Signed)
 12/05/2023 10:38 AM   Nicolas Ho 06-15-1961 [id number]  Referring provider: Severa Rock HERO, FNP [address] [place],  [place] [address]  No chief complaint on file.   HPI: 63 year old male with prior history of urolithiasis is referred by Rock severa, FNP with history of dysuria, left testicular pain, microscopic hematuria.  Recent scrotal ultrasound revealed normal testicles bilaterally.  He has passed several stones in the remote past, and has never needed intervention for any of these.  Apparently had an evaluation for microscopic hematuria years ago by one of the old Art gallery manager.  He denies any recent nausea or vomiting.      PMH: Past Medical History:  Diagnosis Date   Allergy    GERD (gastroesophageal reflux disease)    Hyperlipidemia    Kidney stones     Surgical History: Past Surgical History:  Procedure Laterality Date   LITHOTRIPSY      Home Medications:  Allergies as of 12/05/2023   No Known Allergies      Medication List        Accurate as of December 05, 2023 10:38 AM. If you have any questions, ask your nurse or doctor.          GINGER PO Take by mouth.   multivitamin tablet Take 1 tablet by mouth daily.   rosuvastatin  10 MG tablet Commonly known as: Crestor  Take 1 tablet (10 mg total) by mouth daily. For cholesterol        Allergies: No Known Allergies  Family History: Family History  Problem Relation Age of Onset   Diabetes Mother    Cancer Father    Hyperlipidemia Sister    Heart disease Brother    Cancer Brother     Social History:  reports that he quit smoking about 40 years ago. His smoking use included cigarettes. He has never used smokeless tobacco. He reports current alcohol use. He reports that he does not use drugs.  ROS: All other review of systems were reviewed and are negative except what is noted above in HPI  Physical Exam: There were no vitals taken for this visit.  Constitutional:  Alert  and oriented, No acute distress. HEENT: [place] AT, moist mucus membranes.  Trachea midline, no masses. Cardiovascular: No clubbing, cyanosis, or edema. Respiratory: Normal respiratory effort, no increased work of breathing. GI: No inguinal hernias GU: Mild left CVA and left lower quadrant tenderness.  No inguinal hernias.  Phallus, testicles normal bilaterally.  Normal anal sphincter tone.  Gland 40 g, symmetric, nonnodular nontender. Lymph: No cervical or inguinal lymphadenopathy. Skin: No rashes, bruises or suspicious lesions. Neurologic: Grossly intact, no focal deficits, moving all 4 extremities. Psychiatric: Normal mood and affect.  Laboratory Data: Lab Results  Component Value Date   WBC 7.7 11/14/2023   HGB 14.6 11/14/2023   HCT 43.7 11/14/2023   MCV 91 11/14/2023   PLT 276 11/14/2023    Lab Results  Component Value Date   CREATININE 1.14 06/16/2023    Lab Results  Component Value Date   PSA 0.8 05/14/2014   PSA 0.93 05/13/2013    No results found for: TESTOSTERONE  No results found for: HGBA1C  Urinalysis    Component Value Date/Time   APPEARANCEUR Clear 11/14/2023 1002   GLUCOSEU Negative 11/14/2023 1002   BILIRUBINUR Negative 11/14/2023 1002   PROTEINUR Negative 11/14/2023 1002   UROBILINOGEN negative 08/06/2014 1538   NITRITE Negative 11/14/2023 1002   LEUKOCYTESUR Negative 11/14/2023 1002    Lab  Results  Component Value Date   LABMICR See below: 11/14/2023   WBCUA 0-5 11/14/2023   RBCUA 0-2 02/09/2016   LABEPIT 0-10 11/14/2023   MUCUS Present 02/09/2016   BACTERIA None seen 11/14/2023    Pertinent Imaging:  Results for orders placed in visit on 08/06/14  DG Abd 1 View  Narrative CLINICAL DATA:  Initial evaluation for left lower quadrant pain and macroscopic hematuria previous history of kidney stones  EXAM: ABDOMEN - 1 VIEW  COMPARISON:  01/20/2005 and 01/15/2010  FINDINGS: Moderate stool throughout the colon. No abnormal opacities  over the renal shadows to suggest calculi. 4 mm opacity lateral left pelvis may represent a phlebolith as seen on 01/20/2005 CT scan. However, the possibility in the appropriate clinical setting of the distal left ureteral stone is not entirely excluded.  IMPRESSION: Left lower quadrant calcification could represent a phlebolith, but the possibility of a ureteral stone is not excluded and this could be further evaluated with CT urogram if indicated.   Electronically Signed By: Quintin Blizzard M.D. On: 08/07/2014 08:15  Prior PSA levels reviewed-most recently 1.32 weeks ago  Urinalysis today revealed microscopic hematuria   Assessment: History of kidney stones-may be dealing with that today with his current symptoms  History of microscopic hematuria with remote history of negative evaluation.  This is present today.  Plan: I have recommended CT stone protocol.  He would like to hold on with this because of the cost  I will send him out with a months worth of tamsulosin , I will see back in about a month for recheck

## 2023-12-25 ENCOUNTER — Ambulatory Visit: Payer: 59 | Admitting: Urology

## 2023-12-25 NOTE — Progress Notes (Signed)
 01/02/2024 3:46 PM   Nicolas Ho 02-14-1961 [id number]  Referring provider: Mechele Claude, MD [address] [place],  [place] [address]  No chief complaint on file.   HPI:  2.4.2025: 63 year old male presented w/ recent h/o dysuria, left testicular pain, microscopic hematuria.  Recent scrotal ultrasound revealed normal testicles bilaterally.  He has passed several stones in the remote past, and has never needed intervention for any of these.  Apparently had an evaluation for microscopic hematuria years ago by one of the old Nicolas Ho.  He did not want CT stone eval.--was sent home w/ Flomax for MET.  3.4.2025:      PMH: Past Medical History:  Diagnosis Date   Allergy    GERD (gastroesophageal reflux disease)    Hyperlipidemia    Kidney stones     Surgical History: Past Surgical History:  Procedure Laterality Date   LITHOTRIPSY      Home Medications:  Allergies as of 01/02/2024   No Known Allergies      Medication List        Accurate as of December 25, 2023  3:46 PM. If you have any questions, ask your nurse or doctor.          GINGER PO Take by mouth.   multivitamin tablet Take 1 tablet by mouth daily.   rosuvastatin 10 MG tablet Commonly known as: Crestor Take 1 tablet (10 mg total) by mouth daily. For cholesterol   tamsulosin 0.4 MG Caps capsule Commonly known as: FLOMAX Take 1 capsule (0.4 mg total) by mouth daily after supper.        Allergies: No Known Allergies  Family History: Family History  Problem Relation Age of Onset   Diabetes Mother    Cancer Father    Hyperlipidemia Sister    Heart disease Brother    Cancer Brother     Social History:  reports that he quit smoking about 40 years ago. His smoking use included cigarettes. He has never used smokeless tobacco. He reports current alcohol use. He reports that he does not use drugs.  ROS: All other review of systems were reviewed and are negative except what is  noted above in HPI  Physical Exam: There were no vitals taken for this visit.  Constitutional:  Alert and oriented, No acute distress. HEENT: [place] AT, moist mucus membranes.  Trachea midline, no masses. Cardiovascular: No clubbing, cyanosis, or edema. Respiratory: Normal respiratory effort, no increased work of breathing. GI: No inguinal hernias GU: Mild left CVA and left lower quadrant tenderness.  No inguinal hernias.  Phallus, testicles normal bilaterally.  Normal anal sphincter tone.  Gland 40 g, symmetric, nonnodular nontender. Lymph: No cervical or inguinal lymphadenopathy. Skin: No rashes, bruises or suspicious lesions. Neurologic: Grossly intact, no focal deficits, moving all 4 extremities. Psychiatric: Normal mood and affect.  Laboratory Data: Lab Results  Component Value Date   WBC 7.7 11/14/2023   HGB 14.6 11/14/2023   HCT 43.7 11/14/2023   MCV 91 11/14/2023   PLT 276 11/14/2023    Lab Results  Component Value Date   CREATININE 1.14 06/16/2023    Lab Results  Component Value Date   PSA 0.8 05/14/2014   PSA 0.93 05/13/2013    No results found for: "TESTOSTERONE"  No results found for: "HGBA1C"  Urinalysis    Component Value Date/Time   APPEARANCEUR Clear 12/05/2023 1043   GLUCOSEU Negative 12/05/2023 1043   BILIRUBINUR Negative 12/05/2023 1043   PROTEINUR Negative 12/05/2023 1043  UROBILINOGEN negative 08/06/2014 1538   NITRITE Negative 12/05/2023 1043   LEUKOCYTESUR Negative 12/05/2023 1043    Lab Results  Component Value Date   LABMICR See below: 12/05/2023   WBCUA None seen 12/05/2023   RBCUA 0-2 02/09/2016   LABEPIT 0-10 12/05/2023   MUCUS Present 02/09/2016   BACTERIA None seen 12/05/2023    Pertinent Imaging:  Results for orders placed in visit on 08/06/14  DG Abd 1 View  Narrative CLINICAL DATA:  Initial evaluation for left lower quadrant pain and macroscopic hematuria previous history of kidney stones  EXAM: ABDOMEN - 1  VIEW  COMPARISON:  01/20/2005 and 01/15/2010  FINDINGS: Moderate stool throughout the colon. No abnormal opacities over the renal shadows to suggest calculi. 4 mm opacity lateral left pelvis may represent a phlebolith as seen on 01/20/2005 CT scan. However, the possibility in the appropriate clinical setting of the distal left ureteral stone is not entirely excluded.  IMPRESSION: Left lower quadrant calcification could represent a phlebolith, but the possibility of a ureteral stone is not excluded and this could be further evaluated with CT urogram if indicated.   Electronically Signed By: Esperanza Heir M.D. On: 08/07/2014 08:15  Prior PSA levels reviewed-most recently 1.32 weeks ago  Urinalysis today revealed microscopic hematuria   Assessment: History of kidney stones-may be dealing with that today with his current symptoms  History of microscopic hematuria with remote history of negative evaluation.  This is present today.  Plan:

## 2024-01-02 ENCOUNTER — Ambulatory Visit (INDEPENDENT_AMBULATORY_CARE_PROVIDER_SITE_OTHER): Payer: 59 | Admitting: Urology

## 2024-01-02 ENCOUNTER — Encounter: Payer: Self-pay | Admitting: Urology

## 2024-01-02 VITALS — BP 144/84 | HR 89

## 2024-01-02 DIAGNOSIS — Z87442 Personal history of urinary calculi: Secondary | ICD-10-CM | POA: Diagnosis not present

## 2024-01-02 DIAGNOSIS — Z09 Encounter for follow-up examination after completed treatment for conditions other than malignant neoplasm: Secondary | ICD-10-CM | POA: Diagnosis not present

## 2024-01-02 DIAGNOSIS — Z87898 Personal history of other specified conditions: Secondary | ICD-10-CM

## 2024-01-02 DIAGNOSIS — R3129 Other microscopic hematuria: Secondary | ICD-10-CM

## 2024-01-03 LAB — URINALYSIS, ROUTINE W REFLEX MICROSCOPIC
Bilirubin, UA: NEGATIVE
Glucose, UA: NEGATIVE
Ketones, UA: NEGATIVE
Leukocytes,UA: NEGATIVE
Nitrite, UA: NEGATIVE
Protein,UA: NEGATIVE
Specific Gravity, UA: 1.01 (ref 1.005–1.030)
Urobilinogen, Ur: 0.2 mg/dL (ref 0.2–1.0)
pH, UA: 7 (ref 5.0–7.5)

## 2024-01-03 LAB — MICROSCOPIC EXAMINATION
Bacteria, UA: NONE SEEN
WBC, UA: NONE SEEN /HPF (ref 0–5)

## 2024-03-20 ENCOUNTER — Encounter: Payer: Self-pay | Admitting: Family Medicine

## 2024-03-20 ENCOUNTER — Ambulatory Visit: Admitting: Family Medicine

## 2024-03-20 VITALS — BP 113/62 | HR 74 | Temp 98.0°F | Ht 68.0 in | Wt 165.0 lb

## 2024-03-20 DIAGNOSIS — R1032 Left lower quadrant pain: Secondary | ICD-10-CM | POA: Diagnosis not present

## 2024-03-20 MED ORDER — ROSUVASTATIN CALCIUM 10 MG PO TABS: 10.0000 mg | ORAL_TABLET | Freq: Every day | ORAL | 1 refills | Status: DC

## 2024-03-20 NOTE — Progress Notes (Unsigned)
 Subjective:  Patient ID: Nicolas Ho, male    DOB: Feb 28, 1961  Age: 63 y.o. MRN: [id number]  CC: Flank Pain (Lower abdominal pain. Kidney stone ruled out by urology. Off and on. Pain a little since the last time he mentioned it. )   HPI Nicolas Ho presents for left lower quad pain. 1/10. Guts "gurgling." Having gas. Using gasex. A little on the right is nothing compared to the left. Dull. Can go as high as 5/10.No relief with DC of eating nuts. Took flomax  for a month. Didn't help much. BM q AM. Not painful, no blood. Regular. Denies melena.      03/20/2024    7:58 AM 07/05/2022    1:13 PM 06/14/2022    8:54 AM  Depression screen PHQ 2/9  Decreased Interest 0 0 0  Down, Depressed, Hopeless 0 0 0  PHQ - 2 Score 0 0 0  Altered sleeping 0    Tired, decreased energy 0    Change in appetite 0    Feeling bad or failure about yourself  0    Trouble concentrating 0    Moving slowly or fidgety/restless 0    Suicidal thoughts 0    PHQ-9 Score 0    Difficult doing work/chores Not difficult at all      History Nicolas Ho has a past medical history of Allergy, GERD (gastroesophageal reflux disease), Hyperlipidemia, and Kidney stones.   He has a past surgical history that includes Lithotripsy.   His family history includes Cancer in his brother and father; Diabetes in his mother; Heart disease in his brother; Hyperlipidemia in his sister.He reports that he quit smoking about 40 years ago. His smoking use included cigarettes. He has never used smokeless tobacco. He reports current alcohol use. He reports that he does not use drugs.    ROS Review of Systems  Constitutional:  Negative for fever.  Respiratory:  Negative for shortness of breath.   Cardiovascular:  Negative for chest pain.  Musculoskeletal:  Negative for arthralgias.  Skin:  Negative for rash.    Objective:  BP 113/62   Pulse 74   Temp 98 F (36.7 C)   Ht 5\' 8"  (1.727 m)   Wt 165 lb (74.8 kg)   SpO2 100%   BMI  25.09 kg/m   BP Readings from Last 3 Encounters:  03/20/24 113/62  01/02/24 (!) 144/84  12/05/23 (!) 166/88    Wt Readings from Last 3 Encounters:  03/20/24 165 lb (74.8 kg)  11/14/23 169 lb (76.7 kg)  06/22/23 171 lb 8 oz (77.8 kg)     Physical Exam Constitutional:      General: He is not in acute distress.    Appearance: He is well-developed.  HENT:     Head: Normocephalic and atraumatic.     Right Ear: External ear normal.     Left Ear: External ear normal.     Nose: Nose normal.  Eyes:     Conjunctiva/sclera: Conjunctivae normal.     Pupils: Pupils are equal, round, and reactive to light.  Cardiovascular:     Rate and Rhythm: Normal rate and regular rhythm.     Heart sounds: Normal heart sounds. No murmur heard. Pulmonary:     Effort: Pulmonary effort is normal. No respiratory distress.     Breath sounds: Normal breath sounds. No wheezing or rales.  Abdominal:     Palpations: Abdomen is soft.     Tenderness: There is abdominal tenderness (mild, LLQ,  near midline).  Musculoskeletal:        General: Normal range of motion.     Cervical back: Normal range of motion and neck supple.  Skin:    General: Skin is warm and dry.  Neurological:     Mental Status: He is alert and oriented to person, place, and time.     Deep Tendon Reflexes: Reflexes are normal and symmetric.  Psychiatric:        Behavior: Behavior normal.        Thought Content: Thought content normal.        Judgment: Judgment normal.      Assessment & Plan:  There are no diagnoses linked to this encounter.   Follow-up: No follow-ups on file.  Roise Cleaver, M.D.

## 2024-04-05 ENCOUNTER — Telehealth: Payer: Self-pay | Admitting: Family Medicine

## 2024-04-05 NOTE — Telephone Encounter (Signed)
 Please review

## 2024-04-05 NOTE — Telephone Encounter (Signed)
 Patient is requesting Imaging be done at at [facility] in [place]. They will need a signed order for Scheduling.  Please fax a signed order to [phone].  Inbasket sent to Dr. Veleta Gerold on 04/01/2024 with no response at this time.

## 2024-04-08 NOTE — Telephone Encounter (Signed)
 Can you find out how to do a signed order. I thought that is what referrals did.

## 2024-04-09 NOTE — Telephone Encounter (Signed)
 oRDER ON YOUR DESK. pLEASE FAX

## 2024-04-09 NOTE — Telephone Encounter (Signed)
 You will need to print out the order and it must have your signature on it.  This is the request of the External Scheduling Center in which Patient requested to be scheduled at.  If Patient has the exam internally (at [facility]) your electronic signature takes place of the written one.

## 2024-04-10 NOTE — Telephone Encounter (Signed)
Order faxed  -LS

## 2024-04-19 ENCOUNTER — Telehealth: Payer: Self-pay | Admitting: Family Medicine

## 2024-04-19 NOTE — Telephone Encounter (Signed)
 Re faxed to the number provided. LS

## 2024-04-19 NOTE — Telephone Encounter (Signed)
 Good Morning Nicolas Ho, I called [facility] to check on this Patient's Scheduling and they state they have not received an order.  Could you please refax the order to [phone].  Thank you for your help :)

## 2024-05-15 ENCOUNTER — Encounter: Payer: Self-pay | Admitting: Family Medicine

## 2024-07-31 ENCOUNTER — Telehealth: Payer: Self-pay | Admitting: Family Medicine

## 2024-07-31 DIAGNOSIS — Z0000 Encounter for general adult medical examination without abnormal findings: Secondary | ICD-10-CM

## 2024-07-31 DIAGNOSIS — E7800 Pure hypercholesterolemia, unspecified: Secondary | ICD-10-CM

## 2024-07-31 DIAGNOSIS — E559 Vitamin D deficiency, unspecified: Secondary | ICD-10-CM

## 2024-07-31 NOTE — Telephone Encounter (Signed)
Future orders have been placed.

## 2024-07-31 NOTE — Telephone Encounter (Signed)
 Pt has appt for labs on 10/2 and needs orders put in. He has physical on 10/08

## 2024-08-01 ENCOUNTER — Other Ambulatory Visit

## 2024-08-01 DIAGNOSIS — E7800 Pure hypercholesterolemia, unspecified: Secondary | ICD-10-CM

## 2024-08-01 DIAGNOSIS — E559 Vitamin D deficiency, unspecified: Secondary | ICD-10-CM

## 2024-08-01 DIAGNOSIS — Z0000 Encounter for general adult medical examination without abnormal findings: Secondary | ICD-10-CM

## 2024-08-01 LAB — LIPID PANEL

## 2024-08-02 LAB — CBC WITH DIFFERENTIAL/PLATELET
Basophils Absolute: 0.1 x10E3/uL (ref 0.0–0.2)
Basos: 1 %
EOS (ABSOLUTE): 0.1 x10E3/uL (ref 0.0–0.4)
Eos: 2 %
Hematocrit: 42.4 % (ref 37.5–51.0)
Hemoglobin: 14 g/dL (ref 13.0–17.7)
Immature Grans (Abs): 0 x10E3/uL (ref 0.0–0.1)
Immature Granulocytes: 0 %
Lymphocytes Absolute: 1.9 x10E3/uL (ref 0.7–3.1)
Lymphs: 27 %
MCH: 31.3 pg (ref 26.6–33.0)
MCHC: 33 g/dL (ref 31.5–35.7)
MCV: 95 fL (ref 79–97)
Monocytes Absolute: 0.5 x10E3/uL (ref 0.1–0.9)
Monocytes: 7 %
Neutrophils Absolute: 4.4 x10E3/uL (ref 1.4–7.0)
Neutrophils: 63 %
Platelets: 303 x10E3/uL (ref 150–450)
RBC: 4.48 x10E6/uL (ref 4.14–5.80)
RDW: 13.1 % (ref 11.6–15.4)
WBC: 7.1 x10E3/uL (ref 3.4–10.8)

## 2024-08-02 LAB — LIPID PANEL
Cholesterol, Total: 166 mg/dL (ref 100–199)
HDL: 62 mg/dL (ref >39)
LDL CALC COMMENT:: 2.7 ratio (ref 0.0–5.0)
LDL Chol Calc (NIH): 83 mg/dL (ref 0–99)
Triglycerides: 120 mg/dL (ref 0–149)
VLDL Cholesterol Cal: 21 mg/dL (ref 5–40)

## 2024-08-02 LAB — COMPREHENSIVE METABOLIC PANEL WITH GFR
ALT: 19 IU/L (ref 0–44)
AST: 20 IU/L (ref 0–40)
Albumin: 4.8 g/dL (ref 3.9–4.9)
Alkaline Phosphatase: 49 IU/L (ref 47–123)
BUN/Creatinine Ratio: 11 (ref 10–24)
BUN: 12 mg/dL (ref 8–27)
Bilirubin Total: 0.5 mg/dL (ref 0.0–1.2)
CO2: 20 mmol/L (ref 20–29)
Calcium: 10 mg/dL (ref 8.6–10.2)
Chloride: 103 mmol/L (ref 96–106)
Creatinine, Ser: 1.08 mg/dL (ref 0.76–1.27)
Globulin, Total: 2 g/dL (ref 1.5–4.5)
Glucose: 104 mg/dL — AB (ref 70–99)
Potassium: 5 mmol/L (ref 3.5–5.2)
Sodium: 141 mmol/L (ref 134–144)
Total Protein: 6.8 g/dL (ref 6.0–8.5)
eGFR: 77 mL/min/1.73 (ref >59)

## 2024-08-02 LAB — PSA, TOTAL AND FREE
PSA, Free Pct: 32.2 %
PSA, Free: 0.58 ng/mL
Prostate Specific Ag, Serum: 1.8 ng/mL (ref 0.0–4.0)

## 2024-08-02 LAB — VITAMIN D 25 HYDROXY (VIT D DEFICIENCY, FRACTURES): Vit D, 25-Hydroxy: 45.1 ng/mL (ref 30.0–100.0)

## 2024-08-03 ENCOUNTER — Other Ambulatory Visit: Payer: Self-pay | Admitting: Family Medicine

## 2024-08-04 ENCOUNTER — Ambulatory Visit: Payer: Self-pay | Admitting: Family Medicine

## 2024-08-04 NOTE — Progress Notes (Signed)
Hello Nicolas Ho,  Your lab result is normal and/or stable.Some minor variations that are not significant are commonly marked abnormal, but do not represent any medical problem for you.  Best regards, Shauntae Reitman, M.D.

## 2024-08-07 ENCOUNTER — Encounter: Payer: Self-pay | Admitting: Family Medicine

## 2024-08-07 ENCOUNTER — Ambulatory Visit: Admitting: Family Medicine

## 2024-08-07 VITALS — BP 116/71 | HR 71 | Temp 98.3°F | Ht 68.0 in | Wt 163.4 lb

## 2024-08-07 DIAGNOSIS — Z23 Encounter for immunization: Secondary | ICD-10-CM | POA: Diagnosis not present

## 2024-08-07 DIAGNOSIS — N281 Cyst of kidney, acquired: Secondary | ICD-10-CM

## 2024-08-07 DIAGNOSIS — E559 Vitamin D deficiency, unspecified: Secondary | ICD-10-CM | POA: Diagnosis not present

## 2024-08-07 DIAGNOSIS — Z0001 Encounter for general adult medical examination with abnormal findings: Secondary | ICD-10-CM

## 2024-08-07 DIAGNOSIS — E7800 Pure hypercholesterolemia, unspecified: Secondary | ICD-10-CM

## 2024-08-07 DIAGNOSIS — Z0000 Encounter for general adult medical examination without abnormal findings: Secondary | ICD-10-CM

## 2024-08-07 LAB — URINALYSIS
Bilirubin, UA: NEGATIVE
Glucose, UA: NEGATIVE
Leukocytes,UA: NEGATIVE
Nitrite, UA: NEGATIVE
Protein,UA: NEGATIVE
Specific Gravity, UA: 1.01 (ref 1.005–1.030)
Urobilinogen, Ur: 0.2 mg/dL (ref 0.2–1.0)
pH, UA: 6 (ref 5.0–7.5)

## 2024-08-07 NOTE — Progress Notes (Signed)
 Subjective:  Patient ID: Nicolas Ho, male    DOB: 05-Feb-1961  Age: 63 y.o. MRN: [id number]  CC: Annual Exam   HPI  Discussed the use of AI scribe software for clinical note transcription with the patient, who gave verbal consent to proceed.  History of Present Illness Nicolas Ho is a 63 year old male who presents for his annual physical exam.  His recent blood work showed a glucose level of 104 mg/dL. He is considering dietary changes to manage his blood sugar levels.  A recent CT scan revealed kidney stones measuring up to 6 mm in the left upper pole and 5 mm in the right upper pole, along with a small cyst in the upper pole of the left kidney. He had a lithotripsy procedure several years ago for kidney stones.  He had a colonoscopy where a polyp was removed; he was told it was precancerous. He has been diagnosed with diverticulosis and manages it with fiber supplements, taking a tablespoon twice a day.  He is currently taking Crestor , which has improved his joint pain and overall muscle condition. His cholesterol levels are well-managed with this medication.  He has had a pneumonia shot in 2015 and is considering a flu shot. He is sexually active without any issues and stays active by walking daily and performing tasks such as applying cool seal on his trailer, although he experienced overheating once during the summer.          08/07/2024   11:20 AM 03/20/2024    7:58 AM 07/05/2022    1:13 PM  Depression screen PHQ 2/9  Decreased Interest 0 0 0  Down, Depressed, Hopeless  0 0  PHQ - 2 Score 0 0 0  Altered sleeping  0   Tired, decreased energy  0   Change in appetite  0   Feeling bad or failure about yourself   0   Trouble concentrating  0   Moving slowly or fidgety/restless  0   Suicidal thoughts  0   PHQ-9 Score  0   Difficult doing work/chores  Not difficult at all     History Nicolas Ho has a past medical history of Allergy, GERD (gastroesophageal reflux  disease), Hyperlipidemia, and Kidney stones.   He has a past surgical history that includes Lithotripsy.   His family history includes Cancer in his brother and father; Diabetes in his mother; Heart disease in his brother; Hyperlipidemia in his sister.He reports that he quit smoking about 41 years ago. His smoking use included cigarettes. He has never used smokeless tobacco. He reports current alcohol use. He reports that he does not use drugs.    ROS Review of Systems  Constitutional:  Negative for activity change, fatigue and unexpected weight change.  HENT:  Negative for congestion, ear pain, hearing loss, postnasal drip and trouble swallowing.   Eyes:  Negative for pain and visual disturbance.  Respiratory:  Negative for cough, chest tightness and shortness of breath.   Cardiovascular:  Negative for chest pain, palpitations and leg swelling.  Gastrointestinal:  Negative for abdominal distention, abdominal pain, blood in stool, constipation, diarrhea, nausea and vomiting.  Endocrine: Negative for cold intolerance, heat intolerance and polydipsia.  Genitourinary:  Negative for difficulty urinating, dysuria, flank pain, frequency and urgency.  Musculoskeletal:  Negative for arthralgias and joint swelling.  Skin:  Negative for color change, rash and wound.  Neurological:  Negative for dizziness, syncope, speech difficulty, weakness, light-headedness, numbness and headaches.  Hematological:  Does not bruise/bleed easily.  Psychiatric/Behavioral:  Negative for confusion, decreased concentration, dysphoric mood and sleep disturbance. The patient is not nervous/anxious.     Objective:  BP 116/71   Pulse 71   Temp 98.3 F (36.8 C)   Ht 5' 8 (1.727 m)   Wt 163 lb 6.4 oz (74.1 kg)   SpO2 96%   BMI 24.84 kg/m   BP Readings from Last 3 Encounters:  08/07/24 116/71  03/20/24 113/62  01/02/24 (!) 144/84    Wt Readings from Last 3 Encounters:  08/07/24 163 lb 6.4 oz (74.1 kg)   03/20/24 165 lb (74.8 kg)  11/14/23 169 lb (76.7 kg)     Physical Exam Constitutional:      Appearance: He is well-developed.  HENT:     Head: Normocephalic and atraumatic.  Eyes:     Pupils: Pupils are equal, round, and reactive to light.  Neck:     Thyroid : No thyromegaly.     Trachea: No tracheal deviation.  Cardiovascular:     Rate and Rhythm: Normal rate and regular rhythm.     Heart sounds: Normal heart sounds. No murmur heard.    No friction rub. No gallop.  Pulmonary:     Breath sounds: Normal breath sounds. No wheezing or rales.  Abdominal:     General: Bowel sounds are normal. There is no distension.     Palpations: Abdomen is soft. There is no mass.     Tenderness: There is no abdominal tenderness.     Hernia: There is no hernia in the left inguinal area.  Genitourinary:    Penis: Normal.      Testes: Normal.  Musculoskeletal:        General: Normal range of motion.     Cervical back: Normal range of motion.  Lymphadenopathy:     Cervical: No cervical adenopathy.  Skin:    General: Skin is warm and dry.  Neurological:     Mental Status: He is alert and oriented to person, place, and time.    Physical Exam GENERAL: Alert, cooperative, well developed, no acute distress. HEENT: Normocephalic, normal oropharynx, moist mucous membranes, extraocular movements intact. CHEST: Clear to auscultation bilaterally, no wheezes, rhonchi, or crackles. CARDIOVASCULAR: Normal heart rate and rhythm, S1 and S2 normal without murmurs. ABDOMEN: Soft, non-tender, non-distended, without organomegaly, normal bowel sounds. GENITOURINARY: No inguinal hernia. EXTREMITIES: No cyanosis or edema. NEUROLOGICAL: Cranial nerves grossly intact, moves all extremities without gross motor or sensory deficit. SKIN: Multiple freckles, no suspicious lesions.   Assessment & Plan:  Well adult exam  Encounter for immunization -     Flu vaccine trivalent PF, 6mos and  older(Flulaval,Afluria,Fluarix,Fluzone)  Pure hypercholesterolemia  Vitamin D  deficiency  Renal cyst -     Urinalysis    Assessment and Plan Assessment & Plan Adult Wellness Visit   An annual physical examination shows overall good health. Blood glucose is 104 mg/dL, not indicative of diabetes. Cholesterol levels are excellent with HDL at 62 mg/dL. No anemia or signs of bloodborne disease. PSA level is normal at 1.8 ng/mL. Perform urinalysis, conduct physical examination, and discuss results of blood work and imaging.  Diverticulosis of colon   Diverticulosis is present, explained as a weakness in the intestinal wall where blood vessels pass through, potentially leading to diverticulitis if material gets trapped. He is managing with fiber intake. Continue fiber intake through diet or supplements and chew nuts thoroughly to avoid complications.  Colonic polyp, status post removal   A  precancerous polyp was removed during colonoscopy. Minimal healing is required post-removal. Schedule a follow-up colonoscopy in three years.  Nephrolithiasis, bilateral   Bilateral kidney stones are present, with the largest measuring 6 mm in the left upper pole and 5 mm in the right upper pole. No obstructive uropathy is noted. Previous lithotripsy was performed.  Renal cyst, left kidney   A small subcentimeter hypodense cyst in the upper pole of the left kidney is likely benign. No current concern from the findings.  Hyperlipidemia   Cholesterol levels are well-managed with Crestor . No side effects are reported, and joint pain has improved since switching medication. Continue Crestor  as prescribed.       Follow-up: No follow-ups on file.  Butler Der, M.D.

## 2024-08-08 ENCOUNTER — Ambulatory Visit: Payer: Self-pay | Admitting: Family Medicine

## 2024-08-09 NOTE — Progress Notes (Signed)
 Noted

## 2024-08-09 NOTE — Telephone Encounter (Signed)
 Patient called. Confirming he has seen and acknowledged the message.

## 2024-11-02 ENCOUNTER — Other Ambulatory Visit: Payer: Self-pay | Admitting: Family Medicine
